# Patient Record
Sex: Male | Born: 1980 | Race: White | Hispanic: No | Marital: Single | Smoking: Current every day smoker
Health system: Southern US, Community
[De-identification: ages and names within clinical notes are randomized; demographics above are authoritative.]

## PROBLEM LIST (undated history)

## (undated) DIAGNOSIS — F32A Depression, unspecified: Secondary | ICD-10-CM

## (undated) DIAGNOSIS — F329 Major depressive disorder, single episode, unspecified: Secondary | ICD-10-CM

## (undated) HISTORY — PX: WISDOM TOOTH EXTRACTION: SHX21

## (undated) HISTORY — PX: MEDIAL COLLATERAL LIGAMENT AND LATERAL COLLATERAL LIGAMENT REPAIR, KNEE: SHX2017

## (undated) HISTORY — PX: ANTERIOR CRUCIATE LIGAMENT REPAIR: SHX115

## (undated) HISTORY — PX: KNEE ARTHROSCOPY WITH POSTERIOR CRUCIATE LIGAMENT (PCL) RECONSTRUCTION: SHX5657

---

## 2009-04-28 ENCOUNTER — Emergency Department (HOSPITAL_COMMUNITY): Admission: EM | Admit: 2009-04-28 | Discharge: 2009-04-29 | Payer: Self-pay | Admitting: Emergency Medicine

## 2009-04-29 ENCOUNTER — Emergency Department (HOSPITAL_COMMUNITY): Admission: EM | Admit: 2009-04-29 | Discharge: 2009-04-29 | Payer: Self-pay | Admitting: Emergency Medicine

## 2013-09-09 ENCOUNTER — Encounter (HOSPITAL_COMMUNITY): Payer: Self-pay | Admitting: Emergency Medicine

## 2013-09-09 ENCOUNTER — Emergency Department (HOSPITAL_COMMUNITY)
Admission: EM | Admit: 2013-09-09 | Discharge: 2013-09-09 | Disposition: A | Discharge status: Home / Self Care | Payer: Self-pay | Attending: Emergency Medicine | Admitting: Emergency Medicine

## 2013-09-09 DIAGNOSIS — F17200 Nicotine dependence, unspecified, uncomplicated: Secondary | ICD-10-CM | POA: Insufficient documentation

## 2013-09-09 DIAGNOSIS — F1010 Alcohol abuse, uncomplicated: Secondary | ICD-10-CM | POA: Insufficient documentation

## 2013-09-09 DIAGNOSIS — F10929 Alcohol use, unspecified with intoxication, unspecified: Secondary | ICD-10-CM

## 2013-09-09 DIAGNOSIS — G479 Sleep disorder, unspecified: Secondary | ICD-10-CM | POA: Insufficient documentation

## 2013-09-09 DIAGNOSIS — F430 Acute stress reaction: Secondary | ICD-10-CM | POA: Insufficient documentation

## 2013-09-09 DIAGNOSIS — R45851 Suicidal ideations: Secondary | ICD-10-CM | POA: Insufficient documentation

## 2013-09-09 DIAGNOSIS — F3289 Other specified depressive episodes: Secondary | ICD-10-CM | POA: Insufficient documentation

## 2013-09-09 DIAGNOSIS — F32A Depression, unspecified: Secondary | ICD-10-CM

## 2013-09-09 DIAGNOSIS — F329 Major depressive disorder, single episode, unspecified: Secondary | ICD-10-CM

## 2013-09-09 HISTORY — DX: Depression, unspecified: F32.A

## 2013-09-09 HISTORY — DX: Major depressive disorder, single episode, unspecified: F32.9

## 2013-09-09 LAB — CBC
HCT: 47.8 % (ref 39.0–52.0)
Hemoglobin: 16.3 g/dL (ref 13.0–17.0)
MCH: 31.5 pg (ref 26.0–34.0)
MCHC: 34.1 g/dL (ref 30.0–36.0)
MCV: 92.3 fL (ref 78.0–100.0)
Platelets: 267 10*3/uL (ref 150–400)
RBC: 5.18 MIL/uL (ref 4.22–5.81)
RDW: 12.4 % (ref 11.5–15.5)
WBC: 10.3 10*3/uL (ref 4.0–10.5)

## 2013-09-09 LAB — RAPID URINE DRUG SCREEN, HOSP PERFORMED
Amphetamines: NOT DETECTED
Barbiturates: NOT DETECTED
Benzodiazepines: NOT DETECTED
Cocaine: NOT DETECTED
Opiates: NOT DETECTED
Tetrahydrocannabinol: NOT DETECTED

## 2013-09-09 LAB — COMPREHENSIVE METABOLIC PANEL
ALT: 113 U/L — ABNORMAL HIGH (ref 0–53)
AST: 58 U/L — ABNORMAL HIGH (ref 0–37)
Albumin: 4.7 g/dL (ref 3.5–5.2)
Alkaline Phosphatase: 92 U/L (ref 39–117)
BUN: 10 mg/dL (ref 6–23)
CO2: 23 mEq/L (ref 19–32)
Calcium: 9.6 mg/dL (ref 8.4–10.5)
Chloride: 98 mEq/L (ref 96–112)
Creatinine, Ser: 0.8 mg/dL (ref 0.50–1.35)
GFR calc Af Amer: >90 mL/min (ref >90)
GFR calc non Af Amer: >90 mL/min (ref >90)
Glucose, Bld: 107 mg/dL — ABNORMAL HIGH (ref 70–99)
Potassium: 4.1 mEq/L (ref 3.7–5.3)
Sodium: 138 mEq/L (ref 137–147)
Total Bilirubin: 0.3 mg/dL (ref 0.3–1.2)
Total Protein: 8.8 g/dL — ABNORMAL HIGH (ref 6.0–8.3)

## 2013-09-09 LAB — ACETAMINOPHEN LEVEL

## 2013-09-09 LAB — ETHANOL: ALCOHOL ETHYL (B): 322 mg/dL — AB (ref 0–11)

## 2013-09-09 LAB — SALICYLATE LEVEL: Salicylate Lvl: <2 mg/dL — ABNORMAL LOW (ref 2.8–20.0)

## 2013-09-09 NOTE — BH Assessment (Signed)
Assessment Note   Patient is a 33 year old white male that reports an inability to sleep.  Patient reports that he only sleeps for 1 hour nightly.  Patient reports feeling of irritability since he is not able to sleep.    Patient reports that his inability to sleep has caused him to think about harming himself.  Patient denies having a plan to harm himself.  Patient denies previous incidents of harming himself.  Patient denies prior suicide attempts. Patient denies prior psychiatric hospitalizations.    Patient denies prior counseling or mental health treatment as an adult.  Patient reports counseling in high school due to low self-esteem.  Patient reports that he has had history of depression but has never taken medications. Presents feeling more depressed tonight with because he is currently unemployed and living with his mother. Patient denies physical, sexual and emotional abuse.   Patient reports drinking alcohol last night one beer.  Patient denies having any problems with alcohol or drugs.  Patient denies detox or treatment facilities.  However, patient has BAL of 322.  Patient denies wanting services for detox or treatment. Patient expresses concern that he is chronically sleep deprived. When he was employed and had health insurance, he was evaluated by his primary care physician for possible sleep apnea. He was recommended to try losing weight first which he did. He continued to have sleep problems. Before getting any sleep study the patient lost his job and insurance.        Axis I: Major Depression, single episode Axis II: Deferred Axis III:  Past Medical History  Diagnosis Date  . Depression    Axis IV: economic problems, occupational problems, other psychosocial or environmental problems, problems with access to health care services and problems with primary support group Axis V: 31-40 impairment in reality testing  Past Medical History:  Past Medical History  Diagnosis Date   . Depression     Past Surgical History  Procedure Laterality Date  . Wisdom tooth extraction    . Anterior cruciate ligament repair    . Medial collateral ligament and lateral collateral ligament repair, knee    . Knee arthroscopy with posterior cruciate ligament (pcl) reconstruction      Family History: History reviewed. No pertinent family history.  Social History:  reports that he has been smoking Cigarettes.  He has been smoking about 0.50 packs per day. He has never used smokeless tobacco. He reports that he drinks alcohol. He reports that he does not use illicit drugs.  Additional Social History:     CIWA: CIWA-Ar BP: 127/74 mmHg Pulse Rate: 103 COWS:    Allergies: No Known Allergies  Home Medications:  (Not in a hospital admission)  OB/GYN Status:  No LMP for male patient.  General Assessment Data [facility] Is this a Tele or Face-to-Face Assessment?: Face-to-Face Is this an Initial Assessment or a Re-assessment for this encounter?: Initial Assessment Living Arrangements: Parent (His mother and her boy ) Can pt return to current living arrangement?: Yes Admission Status: Voluntary Is patient capable of signing voluntary admission?: Yes Transfer from: Acute Hospital Referral Source: Self/Family/Friend  Medical Screening Exam [facility] Walk-in ONLY) Medical Exam completed: Yes  [facility] Crisis Care Plan Living Arrangements: Parent (His mother and her boy ) Name of Psychiatrist: NA Name of Therapist: NA  Education Status Is patient currently in school?: No Current Grade: NA Highest grade of school patient has completed: NA Name of school: NA Contact person: NA  Risk to self Suicidal Ideation: Yes-Currently Present Suicidal Intent: No Is patient at risk for suicide?: Yes Suicidal Plan?: No Access to Means: No What has been your use of drugs/alcohol within the last 12 months?: Alcohol  Previous Attempts/Gestures: No How many times?: 0 Other  Self Harm Risks: NA Triggers for Past Attempts: Unpredictable Intentional Self Injurious Behavior: None Family Suicide History: No Recent stressful life event(s): Job Loss;Financial Problems Persecutory voices/beliefs?: No Depression: Yes Depression Symptoms: Insomnia;Isolating;Guilt;Feeling worthless/self pity;Feeling angry/irritable Substance abuse history and/or treatment for substance abuse?: Yes Suicide prevention information given to non-admitted patients: Yes  Risk to Others Homicidal Ideation: No Thoughts of Harm to Others: No Current Homicidal Intent: No Current Homicidal Plan: No Access to Homicidal Means: No Identified Victim: NA History of harm to others?: No Assessment of Violence: None Noted Violent Behavior Description: NA Does patient have access to weapons?: No Criminal Charges Pending?: No Does patient have a court date: No  Psychosis Hallucinations: None noted Delusions: None noted  Mental Status Report Appear/Hygiene: Disheveled Eye Contact: Fair Motor Activity: Freedom of movement Speech: Logical/coherent Level of Consciousness: Quiet/awake Mood: Anxious;Depressed Affect: Appropriate to circumstance Anxiety Level: Minimal Thought Processes: Coherent;Relevant Judgement: Unimpaired Orientation: Person;Place;Time;Situation Obsessive Compulsive Thoughts/Behaviors: None  Cognitive Functioning Concentration: Decreased Memory: Recent Impaired;Remote Impaired IQ: Average Insight: Fair Impulse Control: Fair Appetite: Fair Weight Loss: 0 Weight Gain: 0 Sleep: Decreased Total Hours of Sleep: 1 Vegetative Symptoms: Decreased grooming  ADLScreening ([facility] Assessment Services) Patient's cognitive ability adequate to safely complete daily activities?: Yes Patient able to express need for assistance with ADLs?: Yes Independently performs ADLs?: Yes (appropriate for developmental age)  Prior Inpatient Therapy Prior Inpatient Therapy: No Prior Therapy  Dates: NA Prior Therapy Facilty/Provider(s): NA Reason for Treatment: NA  Prior Outpatient Therapy Prior Outpatient Therapy: No Prior Therapy Dates: NA Prior Therapy Facilty/Provider(s): NA Reason for Treatment: NA  ADL Screening (condition at time of admission) Patient's cognitive ability adequate to safely complete daily activities?: Yes Patient able to express need for assistance with ADLs?: Yes Independently performs ADLs?: Yes (appropriate for developmental age)         Values / Beliefs Cultural Requests During Hospitalization: None Spiritual Requests During Hospitalization: None        Additional Information 1:1 In Past 12 Months?: No CIRT Risk: No Elopement Risk: No Does patient have medical clearance?: No     Disposition: Pending psych consult  Disposition Initial Assessment Completed for this Encounter: Yes Disposition of Patient: Other dispositions Other disposition(s): Other (Comment) (Pending psych consult in the morning )  On Site Evaluation by:   Reviewed with Physician:    Phillip HealStevenson, Tahnee Cifuentes [place] 09/09/2013 6:20 AM

## 2013-09-09 NOTE — Discharge Instructions (Signed)
Called to find a primary care physician to schedule an outpatient sleep study as discussed  Emergency Department Resource Guide 1) Find a Doctor and Pay Out of Pocket Although you won't have to find out who is covered by your insurance plan, it is a good idea to ask around and get recommendations. You will then need to call the office and see if the doctor you have chosen will accept you as a new patient and what types of options they offer for patients who are self-pay. Some doctors offer discounts or will set up payment plans for their patients who do not have insurance, but you will need to ask so you aren't surprised when you get to your appointment.  2) Contact Your Local Health Department Not all health departments have doctors that can see patients for sick visits, but many do, so it is worth a call to see if yours does. If you don't know where your local health department is, you can check in your phone book. The CDC also has a tool to help you locate your state's health department, and many state websites also have listings of all of their local health departments.  3) Find a Walk-in Clinic If your illness is not likely to be very severe or complicated, you may want to try a walk in clinic. These are popping up all over the country in pharmacies, drugstores, and shopping centers. They're usually staffed by nurse practitioners or physician assistants that have been trained to treat common illnesses and complaints. They're usually fairly quick and inexpensive. However, if you have serious medical issues or chronic medical problems, these are probably not your best option.  No Primary Care Doctor: - Call Health Connect at  [phone] - they can help you locate a primary care doctor that  accepts your insurance, provides certain services, etc. - Physician Referral Service- [phone]  Chronic Pain Problems: Organization         Address  Phone   Notes  [facility]  [phone]  [phone] Patients need to be referred by their primary care doctor.   Medication Assistance: Organization         Address  Phone   Notes  [facility] Med[facility] [address] [place], [place] [address] [phone] --Must be a resident of [place] [facility] [place] -- Must have NO insurance coverage whatsoever (no Medicaid/ Medicare, etc.) -- The pt. MUST have a primary care doctor that directs their care regularly and follows them in the community   MedAssist  [phone]   [employer]  [phone]    Agencies that provide inexpensive medical care: Organization         Address  Phone   Notes  [facility]  [phone]   [facility]    [phone]   [facility] [address] [place], [place] [address] [phone]   [facility] [address], [place] [phone]   Planned Parenthood    [phone]) [phone]   [facility]    [phone]   [facility]  [address], [place] Phone:  [phone], Fax:  [phone] Hours of Operation:  9 am - 6 pm, M-F.  Also accepts Medicaid/Medicare and self-pay.  [facility]  [address], [place] Phone: [phone], Fax: [phone]. Hours of Operation:  8:30  am - 5:30 pm, M-F.  Also accepts Medicaid and self-pay.  [facility] [address], [place] Phone: [phone]   [place], [place], [place] [phone], Ext. [phone] Mondays & Thursdays: 7-9 AM.  First 15 patients are seen on a first come, first serve basis.    [place] Providers:  Organization         Address  Phone   Notes  [facility] [address], [place] [phone] Also accepts self-pay patients.  [facility] [address], [place]   [phone]   [place], [address], [place] [phone]   [facility] [address], [place] [phone]   Lucianne Lei [address], [place]   [phone] Only accepts [place] Access [place] patients after they have their name applied to their card.   Self-Pay (no insurance) in [facility]:  Organization         Address  Phone   Notes  Sickle Cell Patients, [facility] [place] [phone]   [facility] Urgent Care [place] [phone]   [facility] [place]  [place], [place], [place] [phone]   [facility]/Dr. Osei-Bonsu  [address], [place] or [address], [place] [phone] Phone number for both [place] and [place] locations is the same.  Urgent Medical and [facility] [address] [place] [phone]   [facility] [address] [place] or [address] [phone]   [facility] [address] [place] [phone], phone; [phone], fax Sees patients 1st and 3rd Saturday of every month.  Must not qualify for public or private insurance (i.e. Medicaid, Medicare, [place] Health Choice, Veterans' Benefits)  Household income should be no more than 200% of the poverty level The clinic cannot treat you if you are pregnant or think you are pregnant  Sexually transmitted diseases are not treated at the clinic.    Dental Care: Organization         Address  Phone  Notes  [facility] Department of [facility] [place] [phone] Accepts children up to age 64 who are enrolled in [place] or [place]; pregnant women with a Medicaid card; and children who have applied for Medicaid or [place] Health Choice, but  were declined, whose parents can pay a reduced fee at time of service.  [facility] Department of [facility]  [address], [place] [phone] Accepts children up to age 16 who are enrolled in [place] or [facility]; pregnant women with a Medicaid card; and children who have applied for Medicaid or [place] Health Choice, but were declined, whose parents can pay a reduced fee at time of service.  [place] Adult [facility] Access PROGRAM  [place] [phone] Patients are seen by appointment only. Walk-ins are not accepted. [place] will see patients 24 years of age and older. Monday - Tuesday (8am-5pm) Most Wednesdays (8:30-5pm) $30 per visit, cash only  [facility] Adult Dental Access PROGRAM  [address], [facility] [phone] Patients are seen by appointment only.  Walk-ins are not accepted. [place] will see patients 9 years of age and older. One Wednesday Evening (Monthly: Volunteer Based).  $30 per visit, cash only  [place]  [phone] for adults; Children under age 55, call Graduate Pediatric Dentistry at [phone]. Children aged 30-14, please call [phone] to request a pediatric application.  Dental services are provided in all areas of dental care including fillings, crowns and bridges, complete and partial dentures, implants, gum treatment, root canals, and extractions. Preventive care is also provided. Treatment is provided to both adults and children. Patients are selected via a lottery and there is often a waiting list.   [facility] [address] [place]  [phone] [email or web address]   [facility] [address] [place], [place] [phone], Ext. [phone] Second and Fourth Thursday of each month, opens at 6:30 AM; Clinic ends at 9 AM.  Patients are seen on a first-come first-served basis, and a limited number are seen during each clinic.    [facility]  [address] [place] Danker [place], [place] [phone]   Eligibility Requirements You must have lived in [place], [place], or [place] counties for at least the last three months.   You cannot be eligible for state or federal sponsored [employer], including [employer], [place], or [employer].   You generally cannot be eligible for healthcare insurance through your employer.    How to apply: Eligibility screenings are held every Tuesday and Wednesday afternoon from 1:00 pm until 4:00 pm. You do not need an appointment for the interview!  [facility] [address] [place], [place]   [place]  [place] Department  [place]  [phone]    Behavioral Health Resources in the Community: Intensive Outpatient Programs Organization         Address  Phone  Notes  [place]. [address] [place], [place] [phone]   [facility] Outpatient [address] [place], [place]   [facility]: [facility] [address] [place], [place]   [place] [address],  [place], [place] or [phone]   Substance Abuse Resources Organization         Address  Phone  Notes  Alcohol and Drug Services  [phone]   [place]  [phone]   [facility] [place]   Chinita Pester  [phone]   Residential & Outpatient Substance Abuse Program  [phone]   Psychological Services Organization         Address  Phone  Notes  [facility] [place]  [place]  [phone]   [place] [address] [place] or [phone]    Mobile Crisis Teams Organization         Address  Phone  Notes  [facility], Mobile Crisis Care  Unit  [phone]   [facility]  [address] [place], [place]   [place] Levels [address] [place] [phone]    Self-Help/Support Groups Organization         Address  Phone             Notes  [place]. of [place] - variety of support groups  [place] Call for more information  Narcotics  Anonymous (NA), Caring Services [address], [employer] [place]  2 meetings at this location   Residential Facilities manager         Address  Phone  Notes  [facility] [place],    [place]  [phone]   [place]  [address] [place] 695072, [place], [place]   [place], [place] [phone] Admissions: 8am-3pm M-F  Incentives Substance [place] [address]    [place], [place] [phone]   [facility] [address] [place], [place], [place]   [facility] [address],  [place], [place]   [facility] Programs - Intensive Outpatient [address] Kristeen [address], [place], [place]   [facility] ([place] [place].,  [place], [place] [phone] or [phone]   [facility] Services (RTS) [address] [place], [place] Accepts Medicaid  [facility] [place] [address],  [place] [phone] Substance Abuse/Addiction Treatment   [facility] Organization         Address  Phone  Notes  [facility]  [phone]   Domenic Schwab, PhD [address] Arlis Port[place], [place]   [phone] or [phone]   [place], [place] [phone]   [facility] [address], [place], [place] [phone] Insurance/Medicaid/sponsorship through [facility] and Families [address]                                     [place], [place] [phone] [place] [address] [place], [place] [phone]    Dr. Adele Schilder  [phone]   [facility] Dept. 1) [address]  2) [place] 3)  [place] [address], [place] [phone]  [phone]   [place] [phone] or [phone] (After Hours)

## 2013-09-09 NOTE — Consult Note (Signed)
[facility] Consult   Reason for Consult:  Alcohol intoxication, Depression Referring Physician:  EDP Drew Wolf is an 33 y.o. male. Total Time spent with patient: 45 minutes  Assessment: AXIS I:  Alcohol Abuse and Major Depression, single episode AXIS II:  Deferred AXIS III:   Past Medical History  Diagnosis Date  . Depression    AXIS IV:  other psychosocial or environmental problems and problems related to social environment AXIS V:  61-70 mild symptoms  Plan:  No evidence of imminent risk to self or others at present.    Subjective:   Drew Wolf is a 33 y.o. male patient Evaluated for alcohol intoxication, depression.  HPI: Patient is a 33 year old white male that reports an inability to sleep. Patient reports that he only sleeps for 1 hour nightly. Patient reports feeling of irritability since he is not able to sleep.  He also reports that his inability to sleep is due to sleep apnea.  Patient states he sees a doctor at an urgent care facility who advised him to lose weight before he will perform sleep apnea test on him.  Patient states he uses alcohol occassionally to help him sleep.  His alcohol level on arrival was 322 and his LFT are elevated.  Patient denies withdrawal symptoms and no tremors of his fingers are noted.  He does not believe he has alcohol addiction problem but he reported one time CDIOP at the [facility].Drew Wolf He states he is scared of sleeping because his girl friend told him that he holds his breath while  sleeping due to sleep apnea. Patient reports that his inability to sleep has caused him to think about harming himself. Patient denies having a plan to harm himself. Patient denies previous incidents of harming himself. Patient denies prior suicide attempts. Patient denies prior psychiatric hospitalizations.  Patient denies prior counseling or mental health treatment as an adult.. Patient reports that he has had history of depression but  has never taken medications. Patient is unemployed and is living with his mother.  He does not have any outpatient provider or therapist at this time.  He denies SI/HI/AVH.    HPI Elements:   Location:  mood, depression, insomnia. Quality:  mildmoderate, ongoing. Severity:  mild-moderate, ongoing. Context:  insomnia, depression, unemployment.  Past Psychiatric History: Past Medical History  Diagnosis Date  . Depression     reports that he has been smoking Cigarettes.  He has been smoking about 0.50 packs per day. He has never used smokeless tobacco. He reports that he drinks alcohol. He reports that he does not use illicit drugs. History reviewed. No pertinent family history. Family History Substance Abuse: No Family Supports: Yes, List: Living Arrangements: Parent (His mother and her boy ) Can pt return to current living arrangement?: Yes   Allergies:  No Known Allergies  ACT Assessment Complete:  Yes:    Educational Status    Risk to Self: Risk to self Suicidal Ideation: Yes-Currently Present Suicidal Intent: No Is patient at risk for suicide?: Yes Suicidal Plan?: No Access to Means: No What has been your use of drugs/alcohol within the last 12 months?: Alcohol  Previous Attempts/Gestures: No How many times?: 0 Other Self Harm Risks: NA Triggers for Past Attempts: Unpredictable Intentional Self Injurious Behavior: None Family Suicide History: No Recent stressful life event(s): Job Loss;Financial Problems Persecutory voices/beliefs?: No Depression: Yes Depression Symptoms: Insomnia;Isolating;Guilt;Feeling worthless/self pity;Feeling angry/irritable Substance abuse history and/or treatment for substance abuse?: Yes Suicide prevention information  given to non-admitted patients: Yes  Risk to Others: Risk to Others Homicidal Ideation: No Thoughts of Harm to Others: No Current Homicidal Intent: No Current Homicidal Plan: No Access to Homicidal Means: No Identified  Victim: NA History of harm to others?: No Assessment of Violence: None Noted Violent Behavior Description: NA Does patient have access to weapons?: No Criminal Charges Pending?: No Does patient have a court date: No  Abuse:    Prior Inpatient Therapy: Prior Inpatient Therapy Prior Inpatient Therapy: No Prior Therapy Dates: NA Prior Therapy Facilty/Provider(s): NA Reason for Treatment: NA  Prior Outpatient Therapy: Prior Outpatient Therapy Prior Outpatient Therapy: No Prior Therapy Dates: NA Prior Therapy Facilty/Provider(s): NA Reason for Treatment: NA  Additional Information: Additional Information 1:1 In Past 12 Months?: No CIRT Risk: No Elopement Risk: No Does patient have medical clearance?: No                  Objective: Blood pressure 100/63, pulse 110, temperature 97.3 F (36.3 C), temperature source Oral, resp. rate 16, SpO2 98.00%.There is no height or weight on file to calculate BMI. Results for orders placed during the hospital encounter of 09/09/13 (from the past 72 hour(s))  URINE RAPID DRUG SCREEN (HOSP PERFORMED)     Status: None   Collection Time    09/09/13 12:50 AM      Result Value Ref Range   Opiates NONE DETECTED  NONE DETECTED   Cocaine NONE DETECTED  NONE DETECTED   Benzodiazepines NONE DETECTED  NONE DETECTED   Amphetamines NONE DETECTED  NONE DETECTED   Tetrahydrocannabinol NONE DETECTED  NONE DETECTED   Barbiturates NONE DETECTED  NONE DETECTED   Comment:            DRUG SCREEN FOR MEDICAL PURPOSES     ONLY.  IF CONFIRMATION IS NEEDED     FOR ANY PURPOSE, NOTIFY LAB     WITHIN 5 DAYS.                LOWEST DETECTABLE LIMITS     FOR URINE DRUG SCREEN     Drug Class       Cutoff (ng/mL)     Amphetamine      1000     Barbiturate      200     Benzodiazepine   453     Tricyclics       646     Opiates          300     Cocaine          300     THC              50  ACETAMINOPHEN LEVEL     Status: None   Collection Time     09/09/13 12:56 AM      Result Value Ref Range   Acetaminophen (Tylenol), Serum <15.0  10 - 30 ug/mL   Comment:            THERAPEUTIC CONCENTRATIONS VARY     SIGNIFICANTLY. A RANGE OF 10-30     ug/mL MAY BE AN EFFECTIVE     CONCENTRATION FOR MANY PATIENTS.     HOWEVER, SOME ARE BEST TREATED     AT CONCENTRATIONS OUTSIDE THIS     RANGE.     ACETAMINOPHEN CONCENTRATIONS     >150 ug/mL AT 4 HOURS AFTER     INGESTION AND >50 ug/mL AT 12     HOURS AFTER INGESTION ARE  OFTEN ASSOCIATED WITH TOXIC     REACTIONS.  CBC     Status: None   Collection Time    09/09/13 12:56 AM      Result Value Ref Range   WBC 10.3  4.0 - 10.5 K/uL   RBC 5.18  4.22 - 5.81 MIL/uL   Hemoglobin 16.3  13.0 - 17.0 g/dL   HCT 47.8  39.0 - 52.0 %   MCV 92.3  78.0 - 100.0 fL   MCH 31.5  26.0 - 34.0 pg   MCHC 34.1  30.0 - 36.0 g/dL   RDW 12.4  11.5 - 15.5 %   Platelets 267  150 - 400 K/uL  COMPREHENSIVE METABOLIC PANEL     Status: Abnormal   Collection Time    09/09/13 12:56 AM      Result Value Ref Range   Sodium 138  137 - 147 mEq/L   Potassium 4.1  3.7 - 5.3 mEq/L   Chloride 98  96 - 112 mEq/L   CO2 23  19 - 32 mEq/L   Glucose, Bld 107 (*) 70 - 99 mg/dL   BUN 10  6 - 23 mg/dL   Creatinine, Ser 0.80  0.50 - 1.35 mg/dL   Calcium 9.6  8.4 - 10.5 mg/dL   Total Protein 8.8 (*) 6.0 - 8.3 g/dL   Albumin 4.7  3.5 - 5.2 g/dL   AST 58 (*) 0 - 37 U/L   ALT 113 (*) 0 - 53 U/L   Alkaline Phosphatase 92  39 - 117 U/L   Total Bilirubin 0.3  0.3 - 1.2 mg/dL   GFR calc non Af Amer >90  >90 mL/min   GFR calc Af Amer >90  >90 mL/min   Comment: (NOTE)     The eGFR has been calculated using the CKD EPI equation.     This calculation has not been validated in all clinical situations.     eGFR's persistently <90 mL/min signify possible Chronic Kidney     Disease.  ETHANOL     Status: Abnormal   Collection Time    09/09/13 12:56 AM      Result Value Ref Range   Alcohol, Ethyl (B) 322 (*) 0 - 11 mg/dL   Comment:             LOWEST DETECTABLE LIMIT FOR     SERUM ALCOHOL IS 11 mg/dL     FOR MEDICAL PURPOSES ONLY  SALICYLATE LEVEL     Status: Abnormal   Collection Time    09/09/13 12:56 AM      Result Value Ref Range   Salicylate Lvl <5.6 (*) 2.8 - 20.0 mg/dL   Labs are reviewed and are pertinent for Alcohol level 322, elevated LFT .  No current facility-administered medications for this encounter.   Current Outpatient Prescriptions  Medication Sig Dispense Refill  . dextromethorphan (DELSYM) 30 MG/5ML liquid Take 30 mg by mouth as needed for cough.       Physical examination performed by EDP this am 09/09/13 is unremarkable. Psychiatric Specialty Exam:     Blood pressure 100/63, pulse 110, temperature 97.3 F (36.3 C), temperature source Oral, resp. rate 16, SpO2 98.00%.There is no height or weight on file to calculate BMI.  General Appearance: Casual  Eye Contact::  Fair  Speech:  Clear and Coherent and Normal Rate  Volume:  Normal  Mood:  Depressed  Affect:  Congruent, Depressed and Flat  Thought Process:  Coherent and Goal Directed  Orientation:  Full (Time, Place, and Person)  Thought Content:  NA  Suicidal Thoughts:  No  Homicidal Thoughts:  No  Memory:  Immediate;   Good Recent;   Good Remote;   Good  Judgement:  Fair  Insight:  Fair  Psychomotor Activity:  Normal  Concentration:  Good  Recall:  NA  Fund of Knowledge:Good  Language: Good  Akathisia:  NA  Handed:  Right  AIMS (if indicated):     Assets:  Desire for Improvement  Sleep:      Musculoskeletal: Strength & Muscle Tone: within normal limits Gait & Station: normal Patient leans: N/A  Treatment Plan Summary:  Consult and face to face interview with Dr Adele Schilder Patient is discharged with referral for CDIOP preferably at our clinic. Discharging home with information for CDIOP referral  Delfin Gant   PMHNP-BC 09/09/2013 11:26 AM  I have personally seen the patient and agreed with the findings and  involved in the treatment plan. Berniece Andreas, MD

## 2013-09-09 NOTE — ED Notes (Signed)
TTS into see 

## 2013-09-09 NOTE — ED Notes (Signed)
Pt reports increased depression, states there is not any known increase in stress or stressors that have caused this, pt denies taking medication for depression, when asked if he was suicidal pt stated "No, but I don't know that I wouldn't put myself in situations I shouldn't be in." Denies HI or AVH. Pt makes poor eye contact. Pt a&O x4.

## 2013-09-09 NOTE — ED Notes (Signed)
Pt discharged to home with family.  Discharge instructions both verbal and written to pt with verbalization of understanding.  Discharge instructions to include follow up care, suicide safety prevention, and community resource list.  All belongings in pts possession and signed for.  Dr. Lolly MustacheArfeen assessed and talked to pt prior to discharge answering any questions or concerns.   Denies HI/SI, auditory or visual hallucinations on discharge.  No distress noted at discharge.  Pt excited and ready for discharge, with no further questions.  Pt escorted to lobby for discharge.

## 2013-09-09 NOTE — ED Provider Notes (Signed)
CSN: [id number]     Arrival date & time 09/09/13  0013 History   First MD Initiated Contact with Patient 09/09/13 0253     Chief Complaint  Patient presents with  . Medical Clearance     (Consider location/radiation/quality/duration/timing/severity/associated sxs/prior Treatment) HPI History provided by patient. Has a history of depression but has never taken medications. Presents feeling more depressed tonight with some intermittent thoughts of suicide - no specific plan. Patient is currently unemployed, admits to stressors at home including ongoing difficulties with relationships. Patient admits to alcohol use, denies any drugs or ingestions otherwise. He called 911 tonight. He denies any self injury.  Patient expresses concern that he is chronically sleep deprived. When he was employed and had health insurance, he was evaluated by his primary care physician for possible sleep apnea.  He was recommended to try losing weight first which he did. He continued to have sleep problems. Before getting any sleep study, lost his job and insurance. He reports that multiple girlfriends over time have told him that he wakes up choking in his sleep.    Past Medical History  Diagnosis Date  . Depression    Past Surgical History  Procedure Laterality Date  . Wisdom tooth extraction    . Anterior cruciate ligament repair    . Medial collateral ligament and lateral collateral ligament repair, knee    . Knee arthroscopy with posterior cruciate ligament (pcl) reconstruction     History reviewed. No pertinent family history. History  Substance Use Topics  . Smoking status: Current Some Day Smoker -- 0.50 packs/day    Types: Cigarettes  . Smokeless tobacco: Never Used  . Alcohol Use: Yes     Comment: occ.    Review of Systems  Constitutional: Negative for fever and chills.  Eyes: Negative for visual disturbance.  Respiratory: Negative for shortness of breath.   Cardiovascular: Negative for chest  pain.  Gastrointestinal: Negative for vomiting and abdominal pain.  Genitourinary: Negative for dysuria.  Musculoskeletal: Negative for back pain, neck pain and neck stiffness.  Skin: Negative for rash.  Neurological: Negative for headaches.  Psychiatric/Behavioral: Positive for sleep disturbance. Negative for hallucinations.  All other systems reviewed and are negative.      Allergies  Review of patient's allergies indicates no known allergies.  Home Medications   Current Outpatient Rx  Name  Route  Sig  Dispense  Refill  . dextromethorphan (DELSYM) 30 MG/5ML liquid   Oral   Take 30 mg by mouth as needed for cough.          BP 134/87  Pulse 113  Temp(Src) 97.9 F (36.6 C) (Oral)  Resp 20  SpO2 95% Physical Exam  Constitutional: He is oriented to person, place, and time. He appears well-developed and well-nourished.  HENT:  Head: Normocephalic and atraumatic.  Eyes: EOM are normal. Pupils are equal, round, and reactive to light.  Neck: Neck supple.  Cardiovascular: Normal rate, regular rhythm and intact distal pulses.   Pulmonary/Chest: Effort normal and breath sounds normal. No respiratory distress.  Abdominal: Soft. Bowel sounds are normal. He exhibits no distension. There is no tenderness.  Musculoskeletal: Normal range of motion. He exhibits no edema.  Neurological: He is alert and oriented to person, place, and time.  Skin: Skin is warm and dry.  Psychiatric:  Flat, Depressed affect    ED Course  Procedures (including critical care time) Labs Review Labs Reviewed  COMPREHENSIVE METABOLIC PANEL - Abnormal; Notable for the following:  Glucose, Bld 107 (*)    Total Protein 8.8 (*)    AST 58 (*)    ALT 113 (*)    All other components within normal limits  ETHANOL - Abnormal; Notable for the following:    Alcohol, Ethyl (B) 322 (*)    All other components within normal limits  SALICYLATE LEVEL - Abnormal; Notable for the following:    Salicylate Lvl  <2.0 (*)    All other components within normal limits  ACETAMINOPHEN LEVEL  CBC  URINE RAPID DRUG SCREEN (HOSP PERFORMED)   Plan: allow patient to sober, then TTS evaluation.  PT will be given outpatient resources document for primary care followup, potentially would benefit from sleep study.   MDM   Diagnosis: Depression, alcohol intoxication   Labs obtained/reviewed as above - alcohol 322      Sunnie NielsenBrian Mattilyn Crites, MD 09/09/13 [phone]

## 2013-09-09 NOTE — ED Notes (Addendum)
Dr Lolly MustacheArfeen. And josephine NP into see

## 2013-09-09 NOTE — [facility] Suicide Risk Assessment (Signed)
Suicide Risk Assessment  Discharge Assessment     Demographic Factors:  Male, Adolescent or young adult and Unemployed  Total Time spent with patient: 45 minutes  Psychiatric Specialty Exam:     Blood pressure 100/63, pulse 110, temperature 97.3 F (36.3 C), temperature source Oral, resp. rate 16, SpO2 98.00%.There is no height or weight on file to calculate BMI.  General Appearance: Casual and Fairly Groomed  Patent attorneyye Contact::  Fair  Speech:  Clear and Coherent and Normal Rate  Volume:  Normal  Mood:  Depressed  Affect:  Congruent, Depressed and Flat  Thought Process:  Coherent and Goal Directed  Orientation:  Full (Time, Place, and Person)  Thought Content:  NA  Suicidal Thoughts:  No  Homicidal Thoughts:  No  Memory:  Immediate;   Good Recent;   Good Remote;   Good  Judgement:  Good  Insight:  Fair  Psychomotor Activity:  Normal  Concentration:  Good  Recall:  NA  Fund of Knowledge:Good  Language: Good  Akathisia:  NA  Handed:  Right  AIMS (if indicated):     Assets:  Desire for Improvement  Sleep:       Musculoskeletal: Strength & Muscle Tone: within normal limits Gait & Station: normal Patient leans: N/A   Mental Status Per Nursing Assessment::   On Admission:     Current Mental Status by Physician: NA  Loss Factors: NA  Historical Factors: NA  Risk Reduction Factors:   Sense of responsibility to family, Religious beliefs about death, Living with another person, especially a relative and Positive therapeutic relationship  Continued Clinical Symptoms:  Alcohol/Substance Abuse/Dependencies  Cognitive Features That Contribute To Risk:  Polarized thinking    Suicide Risk:  Minimal: No identifiable suicidal ideation.  Patients presenting with no risk factors but with morbid ruminations; may be classified as minimal risk based on the severity of the depressive symptoms  Discharge Diagnoses:   AXIS I:  Major Depression, single episode and Alcohol  abuse AXIS II:  Deferred AXIS III:   Past Medical History  Diagnosis Date  . Depression    AXIS IV:  other psychosocial or environmental problems and problems related to social environment AXIS V:  61-70 mild symptoms  Plan Of Care/Follow-up recommendations:  Activity:  as tolerated Diet:  regular  Is patient on multiple antipsychotic therapies at discharge:  No   Has Patient had three or more failed trials of antipsychotic monotherapy by history:  No  Recommended Plan for Multiple Antipsychotic Therapies: NA    Ronte Parker, C  PMHNP-BC 09/09/2013, 11:53 AM

## 2013-09-09 NOTE — Consult Note (Signed)
Psychiatry to d/c home.  Provided pt with optx follow up to IOP (CD ) to [facility].

## 2016-05-12 ENCOUNTER — Ambulatory Visit (INDEPENDENT_AMBULATORY_CARE_PROVIDER_SITE_OTHER): Payer: 59 | Admitting: Family Medicine

## 2016-05-12 VITALS — BP 136/94 | HR 78 | Temp 98.3°F | Resp 17 | Ht 76.0 in | Wt 242.0 lb

## 2016-05-12 DIAGNOSIS — R7989 Other specified abnormal findings of blood chemistry: Secondary | ICD-10-CM

## 2016-05-12 DIAGNOSIS — R945 Abnormal results of liver function studies: Secondary | ICD-10-CM

## 2016-05-12 DIAGNOSIS — Z23 Encounter for immunization: Secondary | ICD-10-CM

## 2016-05-12 DIAGNOSIS — Z0000 Encounter for general adult medical examination without abnormal findings: Secondary | ICD-10-CM

## 2016-05-12 DIAGNOSIS — R0683 Snoring: Secondary | ICD-10-CM | POA: Diagnosis not present

## 2016-05-12 DIAGNOSIS — R739 Hyperglycemia, unspecified: Secondary | ICD-10-CM

## 2016-05-12 LAB — COMPLETE METABOLIC PANEL WITH GFR
ALT: 39 U/L (ref 9–46)
AST: 30 U/L (ref 10–40)
Albumin: 4.8 g/dL (ref 3.6–5.1)
Alkaline Phosphatase: 66 U/L (ref 40–115)
BUN: 14 mg/dL (ref 7–25)
CO2: 24 mmol/L (ref 20–31)
Calcium: 9.7 mg/dL (ref 8.6–10.3)
Chloride: 103 mmol/L (ref 98–110)
Creat: 0.79 mg/dL (ref 0.60–1.35)
GFR, Est African American: >89 mL/min (ref >=60)
GLUCOSE: 93 mg/dL (ref 65–99)
POTASSIUM: 4.4 mmol/L (ref 3.5–5.3)
SODIUM: 136 mmol/L (ref 135–146)
Total Bilirubin: 0.9 mg/dL (ref 0.2–1.2)
Total Protein: 7.4 g/dL (ref 6.1–8.1)

## 2016-05-12 LAB — HEMOGLOBIN A1C
Hgb A1c MFr Bld: 4.9 % (ref <5.7)
Mean Plasma Glucose: 94 mg/dL

## 2016-05-12 NOTE — Patient Instructions (Addendum)
I will refer you to sleep studies to check for sleep apnea. I will also recheck some bloodwork today to look at your blood sugar and liver tests. Exercise 150 minutes per week. Let me know if you have any questions after today's visit.  Keeping you healthy  Get these tests  Blood pressure- Have your blood pressure checked once a year by your healthcare provider.  Normal blood pressure is 120/80.  Weight- Have your body mass index (BMI) calculated to screen for obesity.  BMI is a measure of body fat based on height and weight. You can also calculate your own BMI at [email or web address]-[email or web address]/.  Cholesterol- Have your cholesterol checked regularly starting at age 34, sooner may be necessary if you have diabetes, high blood pressure, if a family member developed heart diseases at an early age or if you smoke.   Chlamydia, HIV, and other sexual transmitted disease- Get screened each year until the age of 34 then within three months of each new sexual partner.  Diabetes- Have your blood sugar checked regularly if you have high blood pressure, high cholesterol, a family history of diabetes or if you are overweight.  Get these vaccines  Flu shot- Every fall.  Tetanus shot- Every 10 years.  Menactra- Single dose; prevents meningitis.  Take these steps  Don't smoke- If you do smoke, ask your healthcare provider about quitting. For tips on how to quit, go to www.smokefree.gov or call 1-800-QUIT-NOW.  Be physically active- Exercise 5 days a week for at least 30 minutes.  If you are not already physically active start slow and gradually work up to 30 minutes of moderate physical activity.  Examples of moderate activity include walking briskly, mowing the yard, dancing, swimming bicycling, etc.  Eat a healthy diet- Eat a variety of healthy foods such as fruits, vegetables, low fat milk, low fat cheese, yogurt, lean meats, poultry, fish, beans, tofu, etc.  For more information on healthy eating, go  to www.thenutritionsource.org  Drink alcohol in moderation- Limit alcohol intake two drinks or less a day.  Never drink and drive.  Dentist- Brush and floss teeth twice daily; visit your dentis twice a year.  Depression-Your emotional health is as important as your physical health.  If you're feeling down, losing interest in things you normally enjoy please talk with your healthcare provider.  Gun Safety- If you keep a gun in your home, keep it unloaded and with the safety lock on.  Bullets should be stored separately.  Helmet use- Always wear a helmet when riding a motorcycle, bicycle, rollerblading or skateboarding.  Safe sex- If you may be exposed to a sexually transmitted infection, use a condom  Seat belts- Seat bels can save your life; always wear one.  Smoke/Carbon Monoxide detectors- These detectors need to be installed on the appropriate level of your home.  Replace batteries at least once a year.  Skin Cancer- When out in the sun, cover up and use sunscreen SPF 15 or higher.  Violence- If anyone is threatening or hurting you, please tell your healthcare provider.   IF you received an x-ray today, you will receive an invoice from [facility]. Please contact [facility] at [phone] with questions or concerns regarding your invoice.   IF you received labwork today, you will receive an invoice from [facility]. Please contact [facility] at [phone] with questions or concerns regarding your invoice.   Our billing staff will not be able to assist you with questions regarding bills from  these companies.  You will be contacted with the lab results as soon as they are available. The fastest way to get your results is to activate your My Chart account. Instructions are located on the last page of this paperwork. If you have not heard from [place] regarding the results in 2 weeks, please contact this office.

## 2016-05-12 NOTE — Progress Notes (Signed)
By signing my name below, I, Drew Wolf, attest that this documentation has been prepared under the direction and in the presence of Drew Staggers, MD.  Electronically Signed: Arvilla Market, Medical Scribe. 05/12/16. 10:19 AM.  Subjective:    Patient ID: Drew Wolf, male    DOB: June 24, 1981, 35 y.o.   MRN: [id number]  HPI Chief Complaint  Patient presents with  . Annual Exam    HPI Comments: Drew Wolf is a 35 y.o. male who presents to the [facility] for his complete physical. Pt is fasting. Pt denies urinary symptoms.  Immunizations: Pt's last tetanus vaccine was in 2005. Pt has not had his flu shot, and deferred the flu shot this year. Pt denies having small children around him at home.  There is no immunization history on file for this patient.  Vision: Pt is not followed by ophthalmologist and doesn't wear contacts/corrective lenses.  Visual Acuity Screening   Right eye Left eye Both eyes  Without correction:     With correction: 20/20 20/15 20/20    Dentist: Pt needs to find a new dentist since his other dentist closed 6 month ago.  Exercise: Pt exercises 1-2x a week.  Hyperglycemia: Glucose of 107 in 09/09/13  STI Screening: Pt was last tested for gonorrhea, chlamydia, and syphilis "a few years ago", hasn't had a new partner in few years, and has been abstinent. Pt is single and denies engaging in unprotected intercourse. Pt deferred STD testing.  Sleep Apnea: Pt sleep through the night and reports snoring. Pt suspects choking in his sleep was correlated to his heavy drinking done at that time. Pt is "fairly energetic" when he starts his day.  Elevated LFTs: Lab Results  Component Value Date   ALT 113 (H) 09/09/2013   AST 58 (H) 09/09/2013   ALKPHOS 92 09/09/2013   BILITOT 0.3 09/09/2013   Depression: Alcohol intoxication in ED visit in 2012. At that time he had some relationship difficulties and stressors, also per that ED note,  there is possibililty of sleep apnea. Recommended testing but first recommended weight loss, continued to have problems after weight loss. Reported hx of his choking in sleep.  Pt states his ED visit for depression in 2012 was correlated to over drinking. Pt has had DUIs in the past but denies current DUIs in this past year. Pt denies dysphoric mood in the past year. Depression screen PHQ 2/9 05/12/2016  Decreased Interest 0  Down, Depressed, Hopeless 0  PHQ - 2 Score 0   FHX: Heart disease and DM in dad. Pt's dad and grandpa was a heavy drinker.  There are no active problems to display for this patient.  Past Medical History:  Diagnosis Date  . Depression    Past Surgical History:  Procedure Laterality Date  . ANTERIOR CRUCIATE LIGAMENT REPAIR    . KNEE ARTHROSCOPY WITH POSTERIOR CRUCIATE LIGAMENT (PCL) RECONSTRUCTION    . MEDIAL COLLATERAL LIGAMENT AND LATERAL COLLATERAL LIGAMENT REPAIR, KNEE    . WISDOM TOOTH EXTRACTION     No Known Allergies Prior to Admission medications   Not on File   Social History   Social History  . Marital status: Single    Spouse name: N/A  . Number of children: N/A  . Years of education: N/A   Occupational History  . Not on file.   Social History Main Topics  . Smoking status: Current Some Day Smoker    Packs/day: 0.50    Types: Cigarettes  .  Smokeless tobacco: Never Used  . Alcohol use Yes     Comment: occ.  . Drug use: No  . Sexual activity: Not on file   Other Topics Concern  . Not on file   Social History Narrative  . No narrative on file   Review of Systems  13 point ROS was negative. Objective:  Physical Exam  Constitutional: He is oriented to person, place, and time. He appears well-developed and well-nourished.  HENT:  Head: Normocephalic and atraumatic.  Right Ear: External ear normal.  Left Ear: External ear normal.  Mouth/Throat: Oropharynx is clear and moist.  Eyes: Conjunctivae and EOM are normal. Pupils are  equal, round, and reactive to light.  Neck: Normal range of motion. Neck supple. No thyromegaly present.  Cardiovascular: Normal rate, regular rhythm, normal heart sounds and intact distal pulses.   Pulmonary/Chest: Effort normal and breath sounds normal. No respiratory distress. He has no wheezes.  Abdominal: Soft. He exhibits no distension. There is no tenderness.  Musculoskeletal: Normal range of motion. He exhibits no edema or tenderness.  Lymphadenopathy:    He has no cervical adenopathy.  Neurological: He is alert and oriented to person, place, and time. He has normal reflexes.  Skin: Skin is warm and dry.  Psychiatric: He has a normal mood and affect. His behavior is normal.  Vitals reviewed.  BP (!) 136/94 (BP Location: Right Arm, Patient Position: Sitting, Cuff Size: Large)   Pulse 78   Temp 98.3 F (36.8 C) (Oral)   Resp 17   Ht 6\' 4"  (1.93 m)   Wt 242 lb (109.8 kg)   SpO2 98%   BMI 29.46 kg/m    Assessment & Plan:  [name] is a 35 y.o. male Annual physical exam  --anticipatory guidance as below in AVS, screening labs above. Health maintenance items as above in HPI discussed/recommended as applicable.   - increase exercise.   Snoring - Plan: Ambulatory referral to Sleep Studies  - refer for sleep study - prior hx of waking up with choking sensation, but not recent.   Need for Tdap vaccination - Plan: Tdap vaccine greater than or equal to 7yo IM  Elevated LFTs - Plan: COMPLETE METABOLIC PANEL WITH GFR  -may have been related to alcohol use prior. Denies recent sx's.   Hyperglycemia - Plan: COMPLETE METABOLIC PANEL WITH GFR, Hemoglobin A1C  - prior. Recheck on CMP and A1c.   No orders of the defined types were placed in this encounter.  Patient Instructions    I will refer you to sleep studies to check for sleep apnea. I will also recheck some bloodwork today to look at your blood sugar and liver tests. Exercise 150 minutes per week. Let me know if you  have any questions after today's visit.  Keeping you healthy  Get these tests  Blood pressure- Have your blood pressure checked once a year by your healthcare provider.  Normal blood pressure is 120/80.  Weight- Have your body mass index (BMI) calculated to screen for obesity.  BMI is a measure of body fat based on height and weight. You can also calculate your own BMI at https://www.west-esparza.com/.  Cholesterol- Have your cholesterol checked regularly starting at age 76, sooner may be necessary if you have diabetes, high blood pressure, if a family member developed heart diseases at an early age or if you smoke.   Chlamydia, HIV, and other sexual transmitted disease- Get screened each year until the age of 1 then within three  months of each new sexual partner.  Diabetes- Have your blood sugar checked regularly if you have high blood pressure, high cholesterol, a family history of diabetes or if you are overweight.  Get these vaccines  Flu shot- Every fall.  Tetanus shot- Every 10 years.  Menactra- Single dose; prevents meningitis.  Take these steps  Don't smoke- If you do smoke, ask your healthcare provider about quitting. For tips on how to quit, go to www.smokefree.gov or call 1-800-QUIT-NOW.  Be physically active- Exercise 5 days a week for at least 30 minutes.  If you are not already physically active start slow and gradually work up to 30 minutes of moderate physical activity.  Examples of moderate activity include walking briskly, mowing the yard, dancing, swimming bicycling, etc.  Eat a healthy diet- Eat a variety of healthy foods such as fruits, vegetables, low fat milk, low fat cheese, yogurt, lean meats, poultry, fish, beans, tofu, etc.  For more information on healthy eating, go to www.thenutritionsource.org  Drink alcohol in moderation- Limit alcohol intake two drinks or less a day.  Never drink and drive.  Dentist- Brush and floss teeth twice daily; visit your dentis  twice a year.  Depression-Your emotional health is as important as your physical health.  If you're feeling down, losing interest in things you normally enjoy please talk with your healthcare provider.  Gun Safety- If you keep a gun in your home, keep it unloaded and with the safety lock on.  Bullets should be stored separately.  Helmet use- Always wear a helmet when riding a motorcycle, bicycle, rollerblading or skateboarding.  Safe sex- If you may be exposed to a sexually transmitted infection, use a condom  Seat belts- Seat bels can save your life; always wear one.  Smoke/Carbon Monoxide detectors- These detectors need to be installed on the appropriate level of your home.  Replace batteries at least once a year.  Skin Cancer- When out in the sun, cover up and use sunscreen SPF 15 or higher.  Violence- If anyone is threatening or hurting you, please tell your healthcare provider.   IF you received an x-ray today, you will receive an invoice from [facility]. Please contact [facility] at [phone] with questions or concerns regarding your invoice.   IF you received labwork today, you will receive an invoice from [facility]. Please contact [facility] at [phone] with questions or concerns regarding your invoice.   Our billing staff will not be able to assist you with questions regarding bills from these companies.  You will be contacted with the lab results as soon as they are available. The fastest way to get your results is to activate your My Chart account. Instructions are located on the last page of this paperwork. If you have not heard from us regarding the results in 2 weeks, please contact this office.        I personally performed the services described in this documentation, which was scribed in my presence. The recorded information has been reviewed and considered, and addended by me as needed.   Signed,   Drew StaggersJeffrey  Jo Cerone, MD [facility].  05/14/16 8:23 AM

## 2016-11-27 ENCOUNTER — Encounter: Payer: Self-pay | Admitting: Family Medicine

## 2016-11-27 ENCOUNTER — Ambulatory Visit (INDEPENDENT_AMBULATORY_CARE_PROVIDER_SITE_OTHER): Payer: 59 | Admitting: Family Medicine

## 2016-11-27 VITALS — BP 156/88 | HR 89 | Temp 98.2°F | Resp 17 | Ht 76.0 in | Wt 243.0 lb

## 2016-11-27 DIAGNOSIS — J301 Allergic rhinitis due to pollen: Secondary | ICD-10-CM | POA: Diagnosis not present

## 2016-11-27 DIAGNOSIS — J0100 Acute maxillary sinusitis, unspecified: Secondary | ICD-10-CM | POA: Diagnosis not present

## 2016-11-27 MED ORDER — PSEUDOEPHEDRINE HCL ER 120 MG PO TB12: 120.0000 mg | ORAL_TABLET | Freq: Two times a day (BID) | ORAL | 0 refills | Status: DC

## 2016-11-27 MED ORDER — CETIRIZINE HCL 10 MG PO TABS
10.0000 mg | ORAL_TABLET | Freq: Every day | ORAL | 11 refills | Status: DC
Start: 2016-11-27 — End: 2020-07-15

## 2016-11-27 MED ORDER — AMOXICILLIN 500 MG PO CAPS: 1000.0000 mg | ORAL_CAPSULE | Freq: Two times a day (BID) | ORAL | 0 refills | Status: DC

## 2016-11-27 NOTE — Progress Notes (Signed)
By signing my name below, I, [name], attest that this documentation has been prepared under the direction and in the presence of Norberto Sorenson, MD.  Electronically Signed: Arvilla Market, Medical Scribe. 11/27/16. 5:29 PM.  Subjective:    Patient ID: Drew Wolf, male    DOB: [birth year], 36 y.o.   MRN: [id number]  HPI Chief Complaint  Patient presents with  . Allergic Rhinitis     HPI Comments: Tyshawn Ciullo is a 36 y.o. male who presents to [facility] Care at [facility] complaining of seasonal allergies onset 1.5 weeks ago. Reports associated sxs of congestion, rhinorrhea, wet cough with chunky sputum, "scratchy" throat, sleep disturbance, chills a couple of days ago but has resolved, sinus pressure, and sinus pain. He took muscinex, advil, and tylenol for little relief of his sxs. Denies itchy eyes, watery eyes.   There are no active problems to display for this patient.  Past Medical History:  Diagnosis Date  . Depression    Past Surgical History:  Procedure Laterality Date  . ANTERIOR CRUCIATE LIGAMENT REPAIR    . KNEE ARTHROSCOPY WITH POSTERIOR CRUCIATE LIGAMENT (PCL) RECONSTRUCTION    . MEDIAL COLLATERAL LIGAMENT AND LATERAL COLLATERAL LIGAMENT REPAIR, KNEE    . WISDOM TOOTH EXTRACTION     No Known Allergies Prior to Admission medications   Medication Sig Start Date End Date Taking? Authorizing Provider  guaiFENesin (MUCINEX) 600 MG 12 hr tablet Take by mouth 2 (two) times daily.   Yes [provider]  ibuprofen (ADVIL,MOTRIN) 200 MG tablet Take 200 mg by mouth every 6 (six) hours as needed.   Yes [provider]  pseudoephedrine-acetaminophen (TYLENOL SINUS) 30-500 MG TABS tablet Take 1 tablet by mouth every 4 (four) hours as needed.   Yes [provider]   Social History   Social History  . Marital status: Single    Spouse name: N/A  . Number of children: N/A  . Years of education: N/A   Occupational History  . Not on file.    Social History Main Topics  . Smoking status: Current Some Day Smoker    Packs/day: 0.50    Types: Cigarettes  . Smokeless tobacco: Never Used  . Alcohol use Yes     Comment: occ.  . Drug use: No  . Sexual activity: No   Other Topics Concern  . Not on file   Social History Narrative  . No narrative on file   Review of Systems  Constitutional: Positive for chills.  HENT: Positive for congestion, rhinorrhea, sinus pain, sinus pressure and sore throat.   Eyes: Negative for discharge and itching.  Respiratory: Positive for cough.   Allergic/Immunologic: Positive for environmental allergies.  Psychiatric/Behavioral: Positive for sleep disturbance.   Objective:  Physical Exam  Constitutional: He appears well-developed and well-nourished. No distress.  HENT:  Head: Normocephalic and atraumatic.  Right Ear: External ear and ear canal normal.  Left Ear: Tympanic membrane is injected. A middle ear effusion is present.  Nose: Mucosal edema and rhinorrhea (purulent, R>L) present.  Mouth/Throat: Posterior oropharyngeal erythema present.  Eyes: Conjunctivae are normal.  Mild erythema and puffiness beneath each eye bilterally  Neck: Neck supple.  Cardiovascular: Normal rate, regular rhythm, S1 normal, S2 normal and normal heart sounds.  Exam reveals no gallop and no friction rub.   No murmur heard. Pulmonary/Chest: Effort normal and breath sounds normal. No respiratory distress. He has no wheezes. He has no rales.  Lymphadenopathy:       Head (right  side): Submandibular adenopathy present. No occipital adenopathy present.       Head (left side): Submandibular adenopathy present. No occipital adenopathy present.    He has no cervical adenopathy.  Neurological: He is alert.  Skin: Skin is warm and dry.  Psychiatric: He has a normal mood and affect. His behavior is normal.  Nursing note and vitals reviewed.   Vitals:   11/27/16 1655  BP: (!) 156/88  Pulse: 89  Resp: 17  Temp:  98.2 F (36.8 C)  TempSrc: Oral  SpO2: 97%  Weight: 243 lb (110.2 kg)  Height: 6\' 4"  (1.93 m)  Body mass index is 29.58 kg/m. Assessment & Plan:   1. Seasonal allergic rhinitis due to pollen   2. Acute non-recurrent maxillary sinusitis      Meds ordered this encounter  Medications  . guaiFENesin (MUCINEX) 600 MG 12 hr tablet    Sig: Take by mouth 2 (two) times daily.  Marland Kitchen. ibuprofen (ADVIL,MOTRIN) 200 MG tablet    Sig: Take 200 mg by mouth every 6 (six) hours as needed.  . pseudoephedrine-acetaminophen (TYLENOL SINUS) 30-500 MG TABS tablet    Sig: Take 1 tablet by mouth every 4 (four) hours as needed.  Marland Kitchen. amoxicillin (AMOXIL) 500 MG capsule    Sig: Take 2 capsules (1,000 mg total) by mouth 2 (two) times daily.    Dispense:  40 capsule    Refill:  0  . pseudoephedrine (SUDAFED 12 HOUR) 120 MG 12 hr tablet    Sig: Take 1 tablet (120 mg total) by mouth 2 (two) times daily.    Dispense:  30 tablet    Refill:  0  . cetirizine (ZYRTEC) 10 MG tablet    Sig: Take 1 tablet (10 mg total) by mouth at bedtime.    Dispense:  30 tablet    Refill:  11    I personally performed the services described in this documentation, which was scribed in my presence. The recorded information has been reviewed and considered, and addended by me as needed.   Norberto SorensonEva Shaw, M.D.  Primary Care at [facility]   [address] [place], [place] [address] [phone] phone [phone] fax  11/27/16 10:19 PM

## 2016-11-27 NOTE — Patient Instructions (Addendum)
   IF you received an x-ray today, you will receive an invoice from [facility]. Please contact [facility] at [phone] with questions or concerns regarding your invoice.   IF you received labwork today, you will receive an invoice from [facility]. Please contact [facility] at [phone] with questions or concerns regarding your invoice.   Our billing staff will not be able to assist you with questions regarding bills from these companies.  You will be contacted with the lab results as soon as they are available. The fastest way to get your results is to activate your My Chart account. Instructions are located on the last page of this paperwork. If you have not heard from us regarding the results in 2 weeks, please contact this office.      Sinusitis, Adult Sinusitis is soreness and inflammation of your sinuses. Sinuses are hollow spaces in the bones around your face. Your sinuses are located:  Around your eyes.  In the middle of your forehead.  Behind your nose.  In your cheekbones. Your sinuses and nasal passages are lined with a stringy fluid (mucus). Mucus normally drains out of your sinuses. When your nasal tissues become inflamed or swollen, the mucus can become trapped or blocked so air cannot flow through your sinuses. This allows bacteria, viruses, and funguses to grow, which leads to infection. Sinusitis can develop quickly and last for 7?10 days (acute) or for more than 12 weeks (chronic). Sinusitis often develops after a cold. What are the causes? This condition is caused by anything that creates swelling in the sinuses or stops mucus from draining, including:  Allergies.  Asthma.  Bacterial or viral infection.  Abnormally shaped bones between the nasal passages.  Nasal growths that contain mucus (nasal polyps).  Narrow sinus openings.  Pollutants, such as chemicals or irritants in the air.  A foreign object stuck in the nose.  A fungal  infection. This is rare. What increases the risk? The following factors may make you more likely to develop this condition:  Having allergies or asthma.  Having had a recent cold or respiratory tract infection.  Having structural deformities or blockages in your nose or sinuses.  Having a weak immune system.  Doing a lot of swimming or diving.  Overusing nasal sprays.  Smoking. What are the signs or symptoms? The main symptoms of this condition are pain and a feeling of pressure around the affected sinuses. Other symptoms include:  Upper toothache.  Earache.  Headache.  Bad breath.  Decreased sense of smell and taste.  A cough that may get worse at night.  Fatigue.  Fever.  Thick drainage from your nose. The drainage is often green and it may contain pus (purulent).  Stuffy nose or congestion.  Postnasal drip. This is when extra mucus collects in the throat or back of the nose.  Swelling and warmth over the affected sinuses.  Sore throat.  Sensitivity to light. How is this diagnosed? This condition is diagnosed based on symptoms, a medical history, and a physical exam. To find out if your condition is acute or chronic, your health care provider may:  Look in your nose for signs of nasal polyps.  Tap over the affected sinus to check for signs of infection.  View the inside of your sinuses using an imaging device that has a light attached (endoscope). If your health care provider suspects that you have chronic sinusitis, you may also:  Be tested for allergies.  Have a sample of   mucus taken from your nose (nasal culture) and checked for bacteria.  Have a mucus sample examined to see if your sinusitis is related to an allergy. If your sinusitis does not respond to treatment and it lasts longer than 8 weeks, you may have an MRI or CT scan to check your sinuses. These scans also help to determine how severe your infection is. In rare cases, a bone biopsy may  be done to rule out more serious types of fungal sinus disease. How is this treated? Treatment for sinusitis depends on the cause and whether your condition is chronic or acute. If a virus is causing your sinusitis, your symptoms will go away on their own within 10 days. You may be given medicines to relieve your symptoms, including:  Topical nasal decongestants. They shrink swollen nasal passages and let mucus drain from your sinuses.  Antihistamines. These drugs block inflammation that is triggered by allergies. This can help to ease swelling in your nose and sinuses.  Topical nasal corticosteroids. These are nasal sprays that ease inflammation and swelling in your nose and sinuses.  Nasal saline washes. These rinses can help to get rid of thick mucus in your nose. If your condition is caused by bacteria, you will be given an antibiotic medicine. If your condition is caused by a fungus, you will be given an antifungal medicine. Surgery may be needed to correct underlying conditions, such as narrow nasal passages. Surgery may also be needed to remove polyps. Follow these instructions at home: Medicines   Take, use, or apply over-the-counter and prescription medicines only as told by your health care provider. These may include nasal sprays.  If you were prescribed an antibiotic medicine, take it as told by your health care provider. Do not stop taking the antibiotic even if you start to feel better. Hydrate and Humidify   Drink enough water to keep your urine clear or pale yellow. Staying hydrated will help to thin your mucus.  Use a cool mist humidifier to keep the humidity level in your home above 50%.  Inhale steam for 10-15 minutes, 3-4 times a day or as told by your health care provider. You can do this in the bathroom while a hot shower is running.  Limit your exposure to cool or dry air. Rest   Rest as much as possible.  Sleep with your head raised (elevated).  Make sure to  get enough sleep each night. General instructions   Apply a warm, moist washcloth to your face 3-4 times a day or as told by your health care provider. This will help with discomfort.  Wash your hands often with soap and water to reduce your exposure to viruses and other germs. If soap and water are not available, use hand sanitizer.  Do not smoke. Avoid being around people who are smoking (secondhand smoke).  Keep all follow-up visits as told by your health care provider. This is important. Contact a health care provider if:  You have a fever.  Your symptoms get worse.  Your symptoms do not improve within 10 days. Get help right away if:  You have a severe headache.  You have persistent vomiting.  You have pain or swelling around your face or eyes.  You have vision problems.  You develop confusion.  Your neck is stiff.  You have trouble breathing. This information is not intended to replace advice given to you by your health care provider. Make sure you discuss any questions you have with   your health care provider. Document Released: 07/06/2005 Document Revised: 03/01/2016 Document Reviewed: 05/01/2015 Elsevier Interactive Patient Education  2017 [employer]  

## 2018-03-01 DIAGNOSIS — M9903 Segmental and somatic dysfunction of lumbar region: Secondary | ICD-10-CM | POA: Diagnosis not present

## 2018-03-01 DIAGNOSIS — M9902 Segmental and somatic dysfunction of thoracic region: Secondary | ICD-10-CM | POA: Diagnosis not present

## 2018-03-01 DIAGNOSIS — M9905 Segmental and somatic dysfunction of pelvic region: Secondary | ICD-10-CM | POA: Diagnosis not present

## 2018-03-05 DIAGNOSIS — Z0000 Encounter for general adult medical examination without abnormal findings: Secondary | ICD-10-CM | POA: Diagnosis not present

## 2018-03-19 DIAGNOSIS — E559 Vitamin D deficiency, unspecified: Secondary | ICD-10-CM | POA: Diagnosis not present

## 2018-03-19 DIAGNOSIS — R945 Abnormal results of liver function studies: Secondary | ICD-10-CM | POA: Diagnosis not present

## 2018-03-19 DIAGNOSIS — E782 Mixed hyperlipidemia: Secondary | ICD-10-CM | POA: Diagnosis not present

## 2018-06-26 DIAGNOSIS — Z79899 Other long term (current) drug therapy: Secondary | ICD-10-CM | POA: Diagnosis not present

## 2018-06-26 DIAGNOSIS — E559 Vitamin D deficiency, unspecified: Secondary | ICD-10-CM | POA: Diagnosis not present

## 2018-06-26 DIAGNOSIS — R748 Abnormal levels of other serum enzymes: Secondary | ICD-10-CM | POA: Diagnosis not present

## 2018-06-26 DIAGNOSIS — M25561 Pain in right knee: Secondary | ICD-10-CM | POA: Diagnosis not present

## 2018-07-05 DIAGNOSIS — M25561 Pain in right knee: Secondary | ICD-10-CM | POA: Diagnosis not present

## 2018-07-06 DIAGNOSIS — M25561 Pain in right knee: Secondary | ICD-10-CM | POA: Diagnosis not present

## 2018-07-11 DIAGNOSIS — M2341 Loose body in knee, right knee: Secondary | ICD-10-CM | POA: Diagnosis not present

## 2018-07-11 DIAGNOSIS — M2410 Other articular cartilage disorders, unspecified site: Secondary | ICD-10-CM | POA: Diagnosis not present

## 2018-07-28 DIAGNOSIS — G8918 Other acute postprocedural pain: Secondary | ICD-10-CM | POA: Diagnosis not present

## 2018-07-28 DIAGNOSIS — M94261 Chondromalacia, right knee: Secondary | ICD-10-CM | POA: Diagnosis not present

## 2018-08-05 DIAGNOSIS — M25561 Pain in right knee: Secondary | ICD-10-CM | POA: Diagnosis not present

## 2018-08-12 DIAGNOSIS — M25561 Pain in right knee: Secondary | ICD-10-CM | POA: Diagnosis not present

## 2018-08-18 DIAGNOSIS — M25561 Pain in right knee: Secondary | ICD-10-CM | POA: Diagnosis not present

## 2018-08-25 DIAGNOSIS — M25561 Pain in right knee: Secondary | ICD-10-CM | POA: Diagnosis not present

## 2018-09-01 DIAGNOSIS — M25561 Pain in right knee: Secondary | ICD-10-CM | POA: Diagnosis not present

## 2020-07-14 DIAGNOSIS — M546 Pain in thoracic spine: Secondary | ICD-10-CM | POA: Insufficient documentation

## 2020-07-14 DIAGNOSIS — F17210 Nicotine dependence, cigarettes, uncomplicated: Secondary | ICD-10-CM | POA: Diagnosis not present

## 2020-07-14 DIAGNOSIS — R109 Unspecified abdominal pain: Secondary | ICD-10-CM | POA: Diagnosis not present

## 2020-07-14 NOTE — ED Triage Notes (Signed)
Pt arrives EMS after a fall around 1930. Pt c/o pain in the left flak area after the fall.

## 2020-07-15 ENCOUNTER — Encounter (HOSPITAL_COMMUNITY): Payer: Self-pay

## 2020-07-15 ENCOUNTER — Other Ambulatory Visit: Payer: Self-pay

## 2020-07-15 ENCOUNTER — Emergency Department (HOSPITAL_COMMUNITY)
Admission: EM | Admit: 2020-07-15 | Discharge: 2020-07-15 | Disposition: A | Discharge status: Home / Self Care | Payer: Commercial Managed Care - PPO | Attending: Emergency Medicine | Admitting: Emergency Medicine

## 2020-07-15 ENCOUNTER — Emergency Department (HOSPITAL_COMMUNITY): Payer: Commercial Managed Care - PPO

## 2020-07-15 DIAGNOSIS — R109 Unspecified abdominal pain: Secondary | ICD-10-CM

## 2020-07-15 LAB — URINALYSIS, ROUTINE W REFLEX MICROSCOPIC
Bilirubin Urine: NEGATIVE
Glucose, UA: NEGATIVE mg/dL
Hgb urine dipstick: NEGATIVE
Ketones, ur: NEGATIVE mg/dL
Leukocytes,Ua: NEGATIVE
Nitrite: NEGATIVE
Protein, ur: NEGATIVE mg/dL
Specific Gravity, Urine: 1.014 (ref 1.005–1.030)
pH: 5 (ref 5.0–8.0)

## 2020-07-15 LAB — CBC WITH DIFFERENTIAL/PLATELET
Abs Immature Granulocytes: 0.06 10*3/uL (ref 0.00–0.07)
Basophils Absolute: 0.1 10*3/uL (ref 0.0–0.1)
Basophils Relative: 0 %
Eosinophils Absolute: 0.1 10*3/uL (ref 0.0–0.5)
Eosinophils Relative: 1 %
HCT: 46.6 % (ref 39.0–52.0)
Hemoglobin: 15.3 g/dL (ref 13.0–17.0)
Immature Granulocytes: 1 %
Lymphocytes Relative: 17 %
Lymphs Abs: 2.2 10*3/uL (ref 0.7–4.0)
MCH: 31 pg (ref 26.0–34.0)
MCHC: 32.8 g/dL (ref 30.0–36.0)
MCV: 94.3 fL (ref 80.0–100.0)
Monocytes Absolute: 0.5 10*3/uL (ref 0.1–1.0)
Monocytes Relative: 4 %
Neutro Abs: 10.1 10*3/uL — ABNORMAL HIGH (ref 1.7–7.7)
Neutrophils Relative %: 77 %
Platelets: 286 10*3/uL (ref 150–400)
RBC: 4.94 MIL/uL (ref 4.22–5.81)
RDW: 12.4 % (ref 11.5–15.5)
WBC: 12.9 10*3/uL — ABNORMAL HIGH (ref 4.0–10.5)
nRBC: 0 % (ref 0.0–0.2)

## 2020-07-15 LAB — BASIC METABOLIC PANEL
Anion gap: 10 (ref 5–15)
BUN: 12 mg/dL (ref 6–20)
CO2: 25 mmol/L (ref 22–32)
Calcium: 9 mg/dL (ref 8.9–10.3)
Chloride: 106 mmol/L (ref 98–111)
Creatinine, Ser: 0.76 mg/dL (ref 0.61–1.24)
GFR, Estimated: >60 mL/min (ref >60)
Glucose, Bld: 112 mg/dL — ABNORMAL HIGH (ref 70–99)
Potassium: 4.1 mmol/L (ref 3.5–5.1)
Sodium: 141 mmol/L (ref 135–145)

## 2020-07-15 MED ORDER — OXYCODONE-ACETAMINOPHEN 5-325 MG PO TABS
2.0000 | ORAL_TABLET | Freq: Once | ORAL | Status: AC
  Administered 2020-07-15: 2 via ORAL
  Filled 2020-07-15: qty 2

## 2020-07-15 MED ORDER — DIAZEPAM 2 MG PO TABS
2.0000 mg | ORAL_TABLET | Freq: Once | ORAL | Status: AC
  Administered 2020-07-15: 2 mg via ORAL
  Filled 2020-07-15: qty 1

## 2020-07-15 NOTE — ED Provider Notes (Signed)
[facility]-EMERGENCY DEPT Provider Note   CSN: [id number] Arrival date & time: 07/14/20  2354     History Chief Complaint  Patient presents with  . Fall  . Flank Pain    Drew Wolf is a 39 y.o. male.  Patient did have a fall however the symptoms he is here describing are nothing new this fall.  Patient states that he was walking to the refrigerator reached out with his left hand to open refrigerator had acute onset of left-sided back pain that radiated to his left lower abdomen and groin area.  Patient states it brought him to his knees.  He continued with pain so presented here for further evaluation.  No urinary changes.  He states the reason the fall was brought up is that he did fall on the couch while messing with his nephew however it was not traumatic did not have any pain at that time.  The pain started hours later.  No fever cough.  Movement makes it worse.  Touching it makes it worse.  No rashes.  No recent illnesses or fevers.   Fall  Flank Pain       Past Medical History:  Diagnosis Date  . Depression     There are no problems to display for this patient.   Past Surgical History:  Procedure Laterality Date  . ANTERIOR CRUCIATE LIGAMENT REPAIR    . KNEE ARTHROSCOPY WITH POSTERIOR CRUCIATE LIGAMENT (PCL) RECONSTRUCTION    . MEDIAL COLLATERAL LIGAMENT AND LATERAL COLLATERAL LIGAMENT REPAIR, KNEE    . WISDOM TOOTH EXTRACTION         No family history on file.  Social History   Tobacco Use  . Smoking status: Current Some Day Smoker    Packs/day: 0.50    Types: Cigarettes  . Smokeless tobacco: Never Used  Substance Use Topics  . Alcohol use: Yes    Comment: occ.  . Drug use: No    Home Medications Prior to Admission medications   Medication Sig Start Date End Date Taking? Authorizing Provider  cetirizine (ZYRTEC) 10 MG tablet Take 1 tablet (10 mg total) by mouth at bedtime. Patient not taking: No sig reported 11/27/16  07/15/20  Sherren Mocha, MD    Allergies    Patient has no known allergies.  Review of Systems   Review of Systems  Genitourinary: Positive for flank pain.  All other systems reviewed and are negative.   Physical Exam Updated Vital Signs BP (!) 145/90   Pulse 90   Temp 98.4 F (36.9 C) (Oral)   Resp 18   Ht 6\' 4"  (1.93 m)   Wt 122.5 kg   SpO2 98%   BMI 32.87 kg/m   Physical Exam Vitals and nursing note reviewed.  Constitutional:      Appearance: He is well-developed and well-nourished.  HENT:     Head: Normocephalic and atraumatic.     Nose: No congestion or rhinorrhea.     Mouth/Throat:     Mouth: Mucous membranes are moist.     Pharynx: Oropharynx is clear.  Eyes:     Pupils: Pupils are equal, round, and reactive to light.  Cardiovascular:     Rate and Rhythm: Normal rate.  Pulmonary:     Effort: Pulmonary effort is normal. No respiratory distress.  Abdominal:     General: Abdomen is flat. There is no distension.  Musculoskeletal:        General: Tenderness (left lower thoracic paraspinal and less  so in left flank) present. Normal range of motion.     Cervical back: Normal range of motion.  Skin:    General: Skin is warm and dry.     Coloration: Skin is not jaundiced or pale.  Neurological:     General: No focal deficit present.     Mental Status: He is alert.     ED Results / Procedures / Treatments   Labs (all labs ordered are listed, but only abnormal results are displayed) Labs Reviewed  CBC WITH DIFFERENTIAL/PLATELET - Abnormal; Notable for the following components:      Result Value   WBC 12.9 (*)    Neutro Abs 10.1 (*)    All other components within normal limits  BASIC METABOLIC PANEL - Abnormal; Notable for the following components:   Glucose, Bld 112 (*)    All other components within normal limits  URINALYSIS, ROUTINE W REFLEX MICROSCOPIC    EKG None  Radiology CT Renal Stone Study  Result Date: 07/15/2020 CLINICAL DATA:  Left  flank pain after fall earlier today. EXAM: CT ABDOMEN AND PELVIS WITHOUT CONTRAST TECHNIQUE: Multidetector CT imaging of the abdomen and pelvis was performed following the standard protocol without IV contrast. COMPARISON:  None. FINDINGS: Lower chest: The lung bases are clear without focal nodule, mass, or airspace disease. Heart size is normal. No significant pleural or pericardial effusion is present. Hepatobiliary: No focal liver abnormality is seen. No gallstones, gallbladder wall thickening, or biliary dilatation. Pancreas: Unremarkable. No pancreatic ductal dilatation or surrounding inflammatory changes. Spleen: Normal in size without focal abnormality. Adrenals/Urinary Tract: Kidneys and ureters are within normal limits. No stone or mass lesion is present. No obstruction is present. The urinary bladder is within normal limits. Stomach/Bowel: The stomach and duodenum are within normal limits. Small bowel is unremarkable. Terminal ileum is within normal limits. The appendix is visualized and normal. The ascending and transverse colon are within normal limits. Descending and sigmoid colon are normal. Vascular/Lymphatic: No significant vascular findings are present. No enlarged abdominal or pelvic lymph nodes. Reproductive: Prostate is unremarkable. Other: No abdominal wall hernia or abnormality. No abdominopelvic ascites. Musculoskeletal: Vertebral body heights and alignment are normal. No focal lytic or blastic lesions are present. Bony pelvis is normal. Hips are located and within normal limits. IMPRESSION: Negative CT of the abdomen and pelvis. Electronically Signed   By: Marin Roberts M.D.   On: 07/15/2020 01:41    Procedures Procedures (including critical care time)  Medications Ordered in ED Medications  oxyCODONE-acetaminophen (PERCOCET/ROXICET) 5-325 MG per tablet 2 tablet (2 tablets Oral Given 07/15/20 0100)  diazepam (VALIUM) tablet 2 mg (2 mg Oral Given 07/15/20 0100)    ED Course   I have reviewed the triage vital signs and the nursing notes.  Pertinent labs & imaging results that were available during my care of the patient were reviewed by me and considered in my medical decision making (see chart for details).    MDM Rules/Calculators/A&P                          Patient without a kidney stone.  Pain improved here with muscle relaxer and with pain medication.  Unclear etiology this time.  Consider possible vascular issue with acute onset and severe pain however patient seems to be more comfortable now.  He had point tenderness on arrival and left flank area consider splenic infarct however did not see any splenomegaly or risk factors for the  same.  Shared decision making with the patient we will hold off on doing a contrasted CT at this time.  If his symptoms worsen, do not get better or has any new symptoms return here immediately for repeat evaluation. Final Clinical Impression(s) / ED Diagnoses Final diagnoses:  Flank pain    Rx / DC Orders ED Discharge Orders    None       Nicolus Ose, Barbara Cower, MD 07/15/20 [phone]

## 2022-05-20 ENCOUNTER — Emergency Department (HOSPITAL_BASED_OUTPATIENT_CLINIC_OR_DEPARTMENT_OTHER): Payer: Commercial Managed Care - PPO

## 2022-05-20 ENCOUNTER — Other Ambulatory Visit: Payer: Self-pay

## 2022-05-20 ENCOUNTER — Encounter (HOSPITAL_BASED_OUTPATIENT_CLINIC_OR_DEPARTMENT_OTHER): Payer: Self-pay

## 2022-05-20 ENCOUNTER — Emergency Department (HOSPITAL_BASED_OUTPATIENT_CLINIC_OR_DEPARTMENT_OTHER)
Admission: EM | Admit: 2022-05-20 | Discharge: 2022-05-20 | Disposition: A | Discharge status: Home / Self Care | Payer: No Typology Code available for payment source | Attending: Emergency Medicine | Admitting: Emergency Medicine

## 2022-05-20 DIAGNOSIS — S0993XA Unspecified injury of face, initial encounter: Secondary | ICD-10-CM | POA: Diagnosis present

## 2022-05-20 DIAGNOSIS — S0232XA Fracture of orbital floor, left side, initial encounter for closed fracture: Secondary | ICD-10-CM | POA: Insufficient documentation

## 2022-05-20 DIAGNOSIS — S00511A Abrasion of lip, initial encounter: Secondary | ICD-10-CM | POA: Insufficient documentation

## 2022-05-20 DIAGNOSIS — S022XXA Fracture of nasal bones, initial encounter for closed fracture: Secondary | ICD-10-CM | POA: Diagnosis not present

## 2022-05-20 DIAGNOSIS — W208XXA Other cause of strike by thrown, projected or falling object, initial encounter: Secondary | ICD-10-CM | POA: Diagnosis not present

## 2022-05-20 DIAGNOSIS — Y9301 Activity, walking, marching and hiking: Secondary | ICD-10-CM | POA: Diagnosis not present

## 2022-05-20 MED ORDER — OXYCODONE-ACETAMINOPHEN 5-325 MG PO TABS: 1.0000 | ORAL_TABLET | Freq: Four times a day (QID) | ORAL | 0 refills | Status: AC | PRN

## 2022-05-20 NOTE — ED Notes (Signed)
Patient transported to CT 

## 2022-05-20 NOTE — Discharge Instructions (Signed)
Your CAT scan showed orbital floor and maxillary sinus fracture as well as a nasal bone fracture.  I have spoken with the on-call ENT he would like to see you in the office on Monday morning.  They will reach out in regards to an appointment.  He would also like you to follow-up with ophthalmology in the next 1 week for an exam.  In the meantime, please keep your head elevated, use ice to the area on for 20 minutes, off for 20 minutes, and do not blow your nose.  You may take ibuprofen or Tylenol for pain, and use the prescribed medication if you have more severe pain.  As we discussed, you should really not work until cleared by your doctors regarding your injury.

## 2022-05-20 NOTE — Progress Notes (Signed)
ENT Brief consult note   Ed called for facial fractures.  AP: left orbital blowout fracture without clinical or radiographic evidence of entrapment (based off emergency physician examination) and nasal bone fracture. Patient currently in community ED setting not at [facility] or [facility]. I've recommended outpatient follow up with myself in 5 to 7 days. Will request clinic follow up Monday, November 6.  Please ensure opht halmology referral is placed to have preop evaluation in case orbital surgery is needed in the next 2 to 3 weeks.

## 2022-05-20 NOTE — ED Triage Notes (Signed)
Patient here POV from   Endorses being struck in the Face by a 900 lb Drum. Occurred at 0900.  No LOC. No Anticoagulants. Hard Hat and Safety Glasses in place.   NAD Noted during Triage. A&Ox4. GCS 15. Ambulatory.

## 2022-05-20 NOTE — ED Provider Notes (Signed)
[facility] EMERGENCY DEPT Provider Note   CSN: [id number] Arrival date & time: 05/20/22  1401     History  Chief Complaint  Patient presents with   Facial Injury    Drew Wolf is a 41 y.o. male.  Patient presents to the emergency department today for evaluation of left-sided facial injury.  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  Patient states that the drum fell and struck the left side of his face.  He denies other injury.  He reports being dazed but no loss of consciousness.  He has had some swelling and bruising of the left face.  No vision changes.  No vomiting or confusion.  No neck pain.  No significant dental injury but he did report some bleeding from the gums.  He has had ibuprofen prior to arrival.  He was wearing a hard hat and safety glasses when the incident occurred.        Home Medications Prior to Admission medications   Medication Sig Start Date End Date Taking? Authorizing Provider  cetirizine (ZYRTEC) 10 MG tablet Take 1 tablet (10 mg total) by mouth at bedtime. Patient not taking: No sig reported 11/27/16 07/15/20  Sherren Mocha, MD      Allergies    Patient has no known allergies.    Review of Systems   Review of Systems  Physical Exam Updated Vital Signs BP (!) 186/88 (BP Location: Right Arm)   Pulse 77   Temp 98 F (36.7 C) (Temporal)   Resp 18   Ht 6\' 4"  (1.93 m)   Wt 122.5 kg   SpO2 99%   BMI 32.87 kg/m   Physical Exam Vitals and nursing note reviewed.  Constitutional:      Appearance: He is well-developed.  HENT:     Head: Normocephalic. No raccoon eyes or Battle's sign.     Comments: Ecchymosis inferior to the left orbit and some abrasions noted on the left upper lip.    Right Ear: Tympanic membrane, ear canal and external ear normal. No hemotympanum.     Left Ear: Tympanic membrane, ear canal and  external ear normal. No hemotympanum.     Nose: Nose normal.     Mouth/Throat:     Mouth: Mucous membranes are moist.     Comments: No obvious dental injury.  No active bleeding.  No tenderness over the mandible.  He does have mild tenderness over the left maxillary bones.  No palpable deformity. Eyes:     General: Lids are normal.     Conjunctiva/sclera: Conjunctivae normal.     Pupils: Pupils are equal, round, and reactive to light.     Comments: No visible hyphema.  Globe appears normal.  Patient reports pain with upward gaze of the left eye.  Cardiovascular:     Rate and Rhythm: Normal rate and regular rhythm.  Pulmonary:     Effort: Pulmonary effort is normal.     Breath sounds: Normal breath sounds.  Abdominal:     Palpations: Abdomen is soft.     Tenderness: There is no abdominal tenderness.  Musculoskeletal:        General: Normal range of motion.     Cervical back: Normal range of motion and neck supple. No tenderness or bony tenderness.     Thoracic back: No tenderness or bony tenderness.  Lumbar back: No tenderness or bony tenderness.  Skin:    General: Skin is warm and dry.  Neurological:     Mental Status: He is alert and oriented to person, place, and time.     GCS: GCS eye subscore is 4. GCS verbal subscore is 5. GCS motor subscore is 6.     Cranial Nerves: No cranial nerve deficit.     Sensory: No sensory deficit.     Coordination: Coordination normal.    ED Results / Procedures / Treatments   Labs (all labs ordered are listed, but only abnormal results are displayed) Labs Reviewed - No data to display  EKG None  Radiology CT Maxillofacial WO CM  Result Date: 05/20/2022 CLINICAL DATA:  Facial trauma. EXAM: CT HEAD WITHOUT CONTRAST CT MAXILLOFACIAL WITHOUT CONTRAST TECHNIQUE: Multidetector CT imaging of the head and maxillofacial structures were performed using the standard protocol without intravenous contrast. Multiplanar CT image reconstructions of the  maxillofacial structures were also generated. RADIATION DOSE REDUCTION: This exam was performed according to the departmental dose-optimization program which includes automated exposure control, adjustment of the mA and/or kV according to patient size and/or use of iterative reconstruction technique. COMPARISON:  None Available. FINDINGS: CT HEAD FINDINGS Brain: No evidence of acute infarction, hemorrhage, hydrocephalus, extra-axial collection or mass lesion/mass effect. Vascular: No hyperdense vessel or unexpected calcification. Skull: Normal. Negative for fracture or focal lesion. Other: None. CT MAXILLOFACIAL FINDINGS Osseous: There appears to be a moderately displaced left orbital floor fracture. Minimally displaced left lateral orbital wall fracture is noted. Moderately depressed left nasal bone fracture is noted. Orbits: Large amount of gas is seen in the left orbit consistent with left orbital floor fracture. Sinuses: Fluid level is noted in left maxillary sinus most consistent with hemorrhage. Soft tissues: Contusion of the subcutaneous tissues is noted overlying the left maxillary region. IMPRESSION: No acute intracranial abnormality seen. Moderately displaced left orbital floor fracture is noted with probable hemorrhage seen in the left maxillary sinus as well as a large amount of gas is seen in the left orbit. Moderately depressed left nasal bone fracture is noted as well. Subcutaneous contusion is seen overlying the left maxillary region. Electronically Signed   By: Lupita Raider M.D.   On: 05/20/2022 15:02   CT Head Wo Contrast  Result Date: 05/20/2022 CLINICAL DATA:  Facial trauma. EXAM: CT HEAD WITHOUT CONTRAST CT MAXILLOFACIAL WITHOUT CONTRAST TECHNIQUE: Multidetector CT imaging of the head and maxillofacial structures were performed using the standard protocol without intravenous contrast. Multiplanar CT image reconstructions of the maxillofacial structures were also generated. RADIATION DOSE  REDUCTION: This exam was performed according to the departmental dose-optimization program which includes automated exposure control, adjustment of the mA and/or kV according to patient size and/or use of iterative reconstruction technique. COMPARISON:  None Available. FINDINGS: CT HEAD FINDINGS Brain: No evidence of acute infarction, hemorrhage, hydrocephalus, extra-axial collection or mass lesion/mass effect. Vascular: No hyperdense vessel or unexpected calcification. Skull: Normal. Negative for fracture or focal lesion. Other: None. CT MAXILLOFACIAL FINDINGS Osseous: There appears to be a moderately displaced left orbital floor fracture. Minimally displaced left lateral orbital wall fracture is noted. Moderately depressed left nasal bone fracture is noted. Orbits: Large amount of gas is seen in the left orbit consistent with left orbital floor fracture. Sinuses: Fluid level is noted in left maxillary sinus most consistent with hemorrhage. Soft tissues: Contusion of the subcutaneous tissues is noted overlying the left maxillary region. IMPRESSION: No acute intracranial abnormality seen. Moderately  displaced left orbital floor fracture is noted with probable hemorrhage seen in the left maxillary sinus as well as a large amount of gas is seen in the left orbit. Moderately depressed left nasal bone fracture is noted as well. Subcutaneous contusion is seen overlying the left maxillary region. Electronically Signed   By: Marijo Conception M.D.   On: 05/20/2022 15:02    Procedures Procedures    Medications Ordered in ED Medications - No data to display  ED Course/ Medical Decision Making/ A&P    Patient seen and examined. History obtained directly from patient.   Labs/EKG: None ordered  Imaging: CT maxillofacial and CT head  Medications/Fluids: Offered pain medication, patient declines.  Most recent vital signs reviewed and are as follows: BP (!) 186/88 (BP Location: Right Arm)   Pulse 77   Temp 98  F (36.7 C) (Temporal)   Resp 18   Ht 6\' 4"  (1.93 m)   Wt 122.5 kg   SpO2 99%   BMI 32.87 kg/m   Initial impression: Facial injury, abrasion, contusion.  Will need to rule out orbital fracture.  3:17 PM Reassessment performed. Patient appears stable.  Imaging personally visualized and interpreted including: CT head and maxillofacial, agree orbital floor fracture, nasal bone fracture, normal head.  Reviewed pertinent lab work and imaging with patient at bedside. Questions answered.   Most current vital signs reviewed and are as follows: BP (!) 186/88 (BP Location: Right Arm)   Pulse 77   Temp 98 F (36.7 C) (Temporal)   Resp 18   Ht 6\' 4"  (1.93 m)   Wt 122.5 kg   SpO2 99%   BMI 32.87 kg/m   Plan: Will need to discuss with on-call ENT for recommendations.    Visual Acuity  Right Eye Distance: 20/15 Left Eye Distance: 20/15 Bilateral Distance: 20/15  Right Eye Near:   Left Eye Near:    Bilateral Near:     3:51 PM Reassessment performed. Patient appears stable.  I consulted with Dr. Sabino Gasser of ENT.  Has reviewed imaging.  Recommends follow-up in the office on Monday, please review ENT will reach out regarding an appointment.  Agrees with no nose blowing, pain control, elevation, ice.  Also would like patient to follow-up with ophthalmology in the next 1 week for an evaluation.  I will provide ENT referral.  Patient and wife updated on plan.  I encouraged patient not to work until cleared by ENT.  Most current vital signs reviewed and are as follows: BP (!) 186/88 (BP Location: Right Arm)   Pulse 77   Temp 98 F (36.7 C) (Temporal)   Resp 18   Ht 6\' 4"  (1.93 m)   Wt 122.5 kg   SpO2 99%   BMI 32.87 kg/m   Plan: Discharge to home.   Prescriptions written for: Percocet  Patient counseled on use of narcotic pain medications. Counseled not to combine these medications with others containing tylenol. Urged not to drink alcohol, drive, or perform any other activities  that requires focus while taking these medications. The patient verbalizes understanding and agrees with the plan.  Other home care instructions discussed: Other than recommendations as above, OTC Tylenol and ibuprofen if pain is more minor.  ED return instructions discussed: Return with confusion, vomiting, severe uncontrolled pain  Follow-up instructions discussed: Specialty follow-up with ENT and ophthalmology as above.  Medical Decision Making Amount and/or Complexity of Data Reviewed Radiology: ordered.   Patient with facial trauma and orbital floor, maxillary, nasal bone fractures.  He has slight restriction with upward gaze but no significant entrapment on exam.  Discussed with on-call ENT with recommendations and follow-up as above.  Patient has normal vision and globe appears intact without significant and degree.  Head CT is negative.  No neck pain.  The patient's vital signs, pertinent lab work and imaging were reviewed and interpreted as discussed in the ED course. Hospitalization was considered for further testing, treatments, or serial exams/observation. However as patient is well-appearing, has a stable exam, and reassuring studies today, I do not feel that they warrant admission at this time. This plan was discussed with the patient who verbalizes agreement and comfort with this plan and seems reliable and able to return to the Emergency Department with worsening or changing symptoms.          Final Clinical Impression(s) / ED Diagnoses Final diagnoses:  Closed fracture of left orbital floor, initial encounter [facility])  Closed fracture of nasal bone, initial encounter    Rx / DC Orders ED Discharge Orders          Ordered    oxyCODONE-acetaminophen (PERCOCET/ROXICET) 5-325 MG tablet  Every 6 hours PRN        05/20/22 1547              Renne Crigler, PA-C 05/20/22 1554    Gwyneth Sprout, MD 05/23/22 1146

## 2022-05-20 NOTE — ED Notes (Signed)
RN provided AVS using Teachback Method. Patient verbalizes understanding of Discharge Instructions. Opportunity for Questioning and Answers were provided by RN. Patient Discharged from ED ambulatory to Home with Family. ? ?

## 2022-08-13 IMAGING — CT CT RENAL STONE PROTOCOL
2 of 4 series · 16 of 46 positions shown, 18 images · non-contrast
Comparison: None.

CLINICAL DATA: Left flank pain after fall earlier today.

EXAM:
CT ABDOMEN AND PELVIS WITHOUT CONTRAST
TECHNIQUE: Multidetector CT imaging of the abdomen and pelvis was performed
following the standard protocol without IV contrast.

[Series 2: axial st · axial · 0.92mm/px · z∈[-614,-99]mm · 13 of 115 slices shown, 15 images]
[im 6/115  soft-tissue]
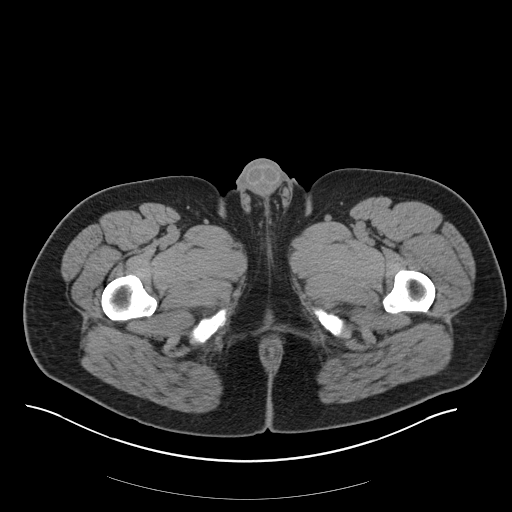
[im 6/115  bone]
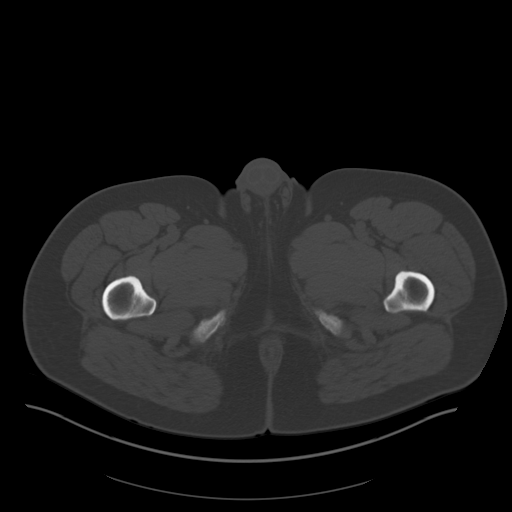
[im 18/115  soft-tissue]
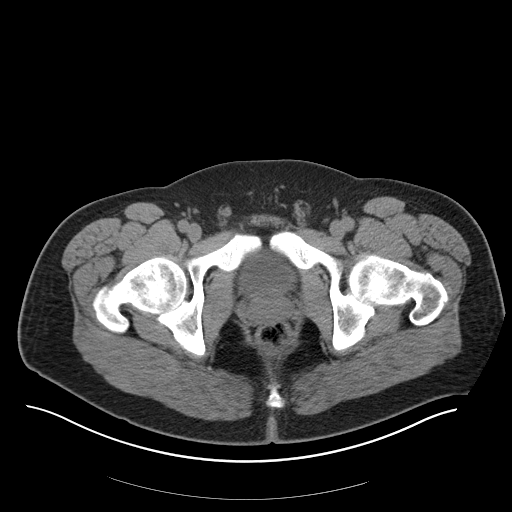
[im 23/115  soft-tissue]
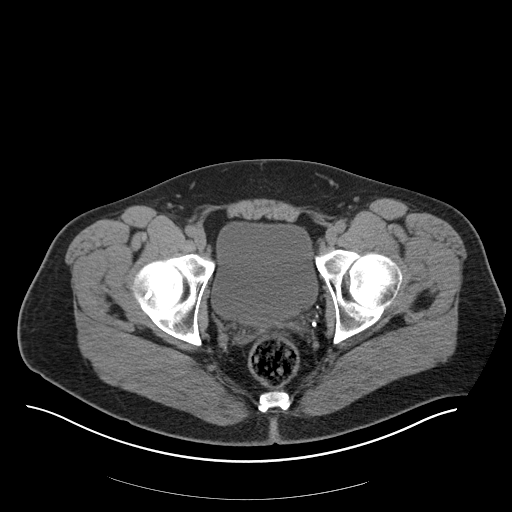
[im 35/115  soft-tissue]
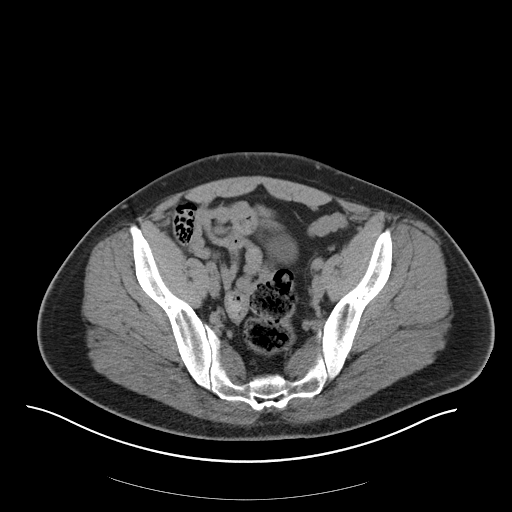
[im 40/115  soft-tissue]
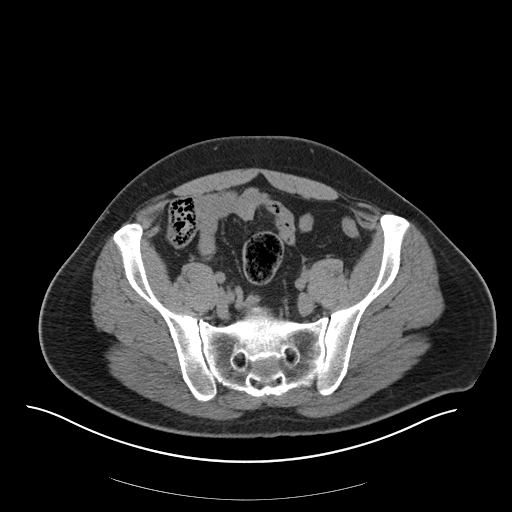
[im 52/115  soft-tissue]
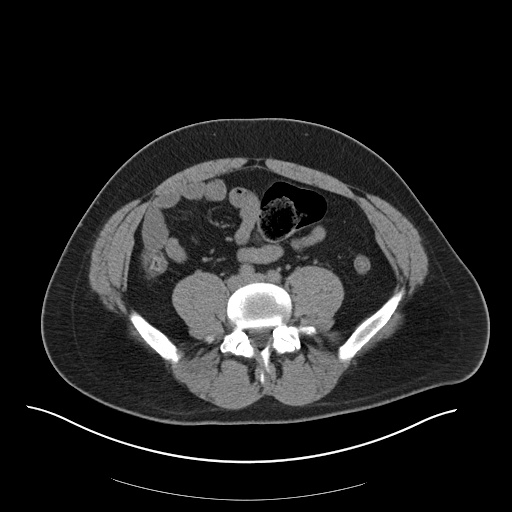
[im 58/115  soft-tissue]
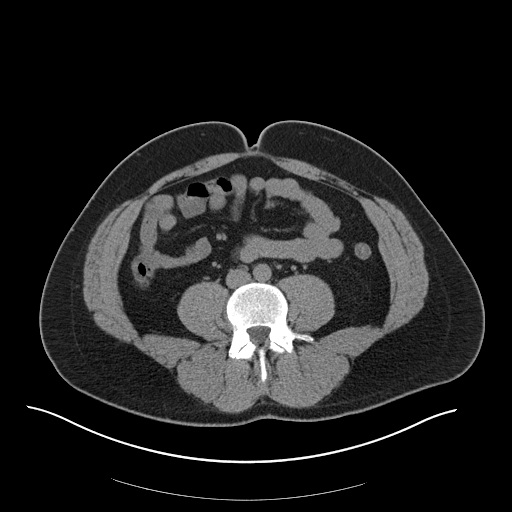
[im 63/115  soft-tissue]
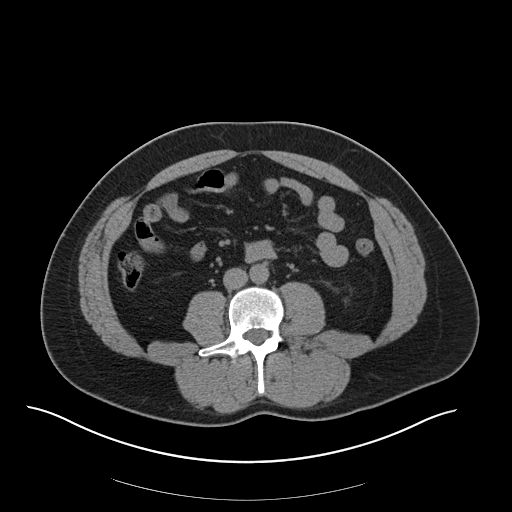
[im 75/115  soft-tissue]
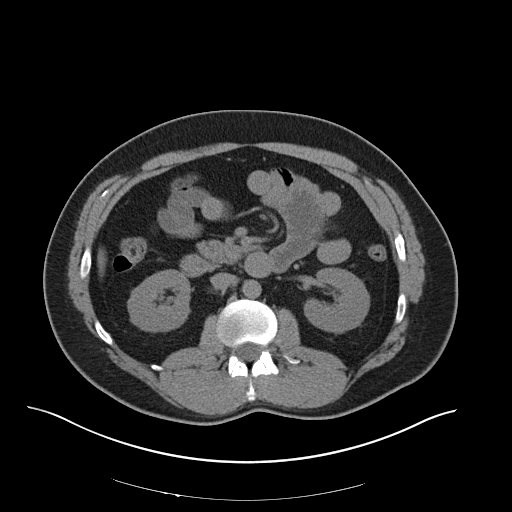
[im 75/115  bone]
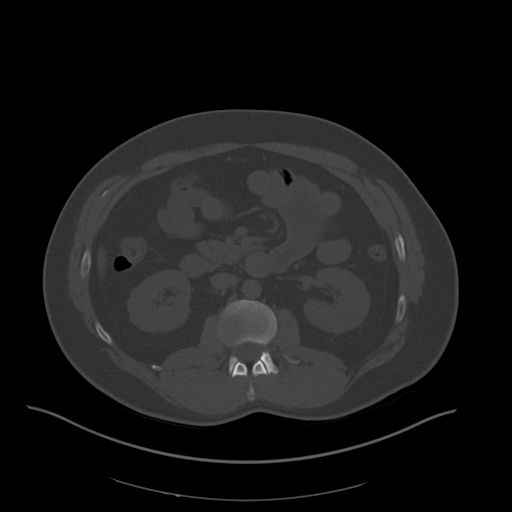
[im 80/115  soft-tissue]
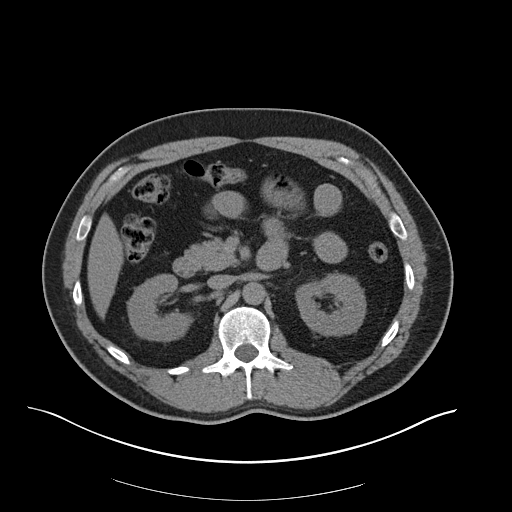
[im 92/115  soft-tissue]
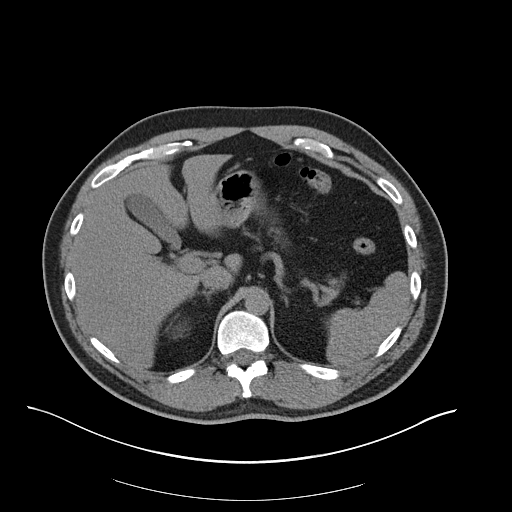
[im 97/115  soft-tissue]
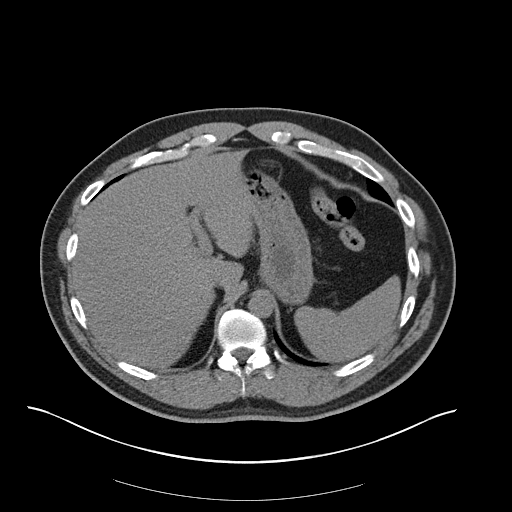
[im 109/115  soft-tissue]
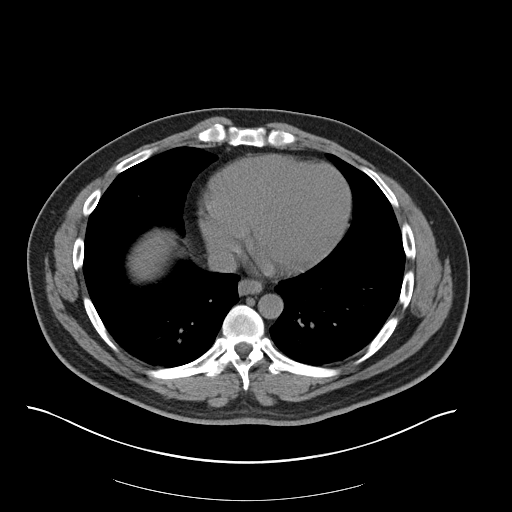

[Series 5: coronal · coronal · 0.89mm/px · 3 of 157 slices shown]
[im 53/157  soft-tissue]
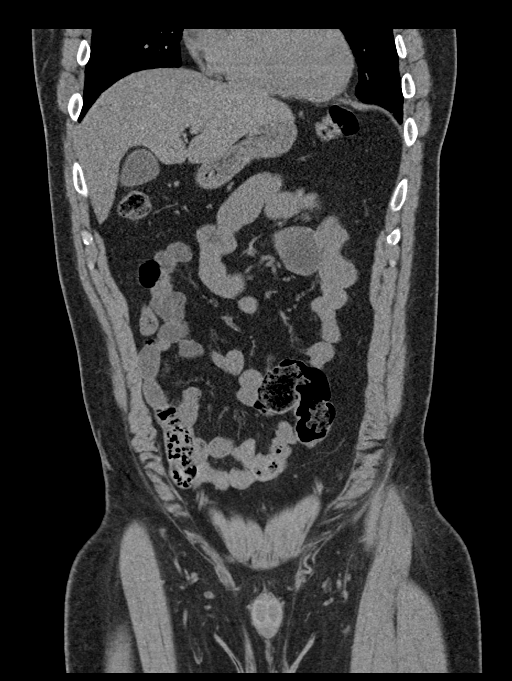
[im 70/157  soft-tissue]
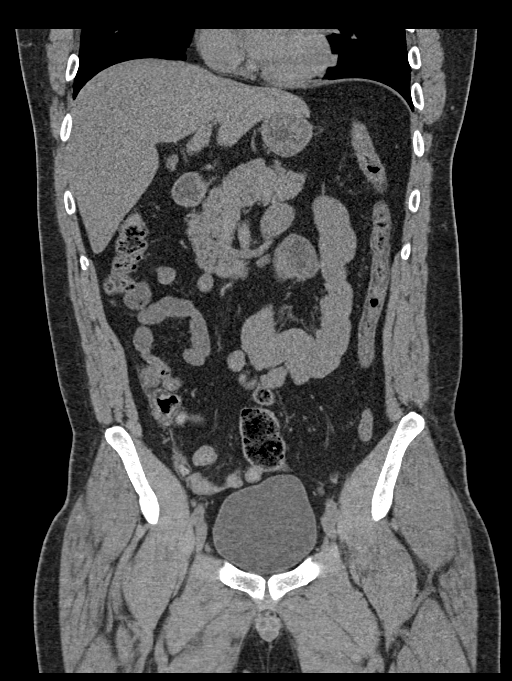
[im 87/157  soft-tissue]
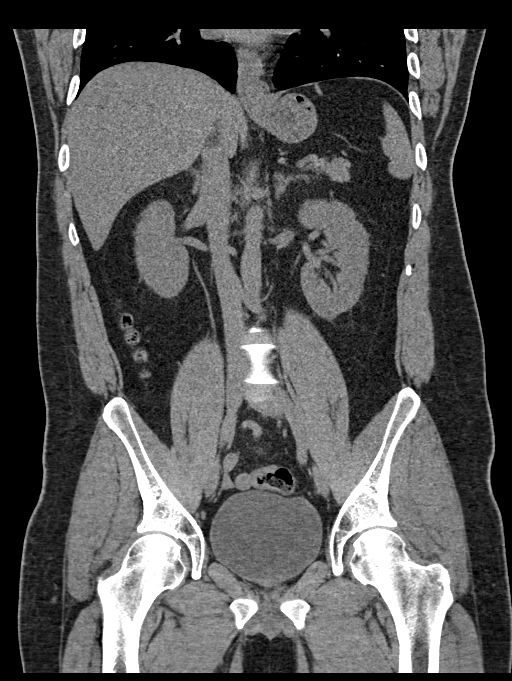

[16 of 46 positions shown; findings below may reference images not displayed]

FINDINGS: Lower chest: The lung bases are clear without focal nodule, mass, or
airspace disease. Heart size is normal. No significant pleural or
pericardial effusion is present.

Hepatobiliary: No focal liver abnormality is seen. No gallstones,
gallbladder wall thickening, or biliary dilatation.

Pancreas: Unremarkable. No pancreatic ductal dilatation or
surrounding inflammatory changes.

Spleen: Normal in size without focal abnormality.

Adrenals/Urinary Tract: Kidneys and ureters are within normal
limits. No stone or mass lesion is present. No obstruction is
present. The urinary bladder is within normal limits.

Stomach/Bowel: The stomach and duodenum are within normal limits.
Small bowel is unremarkable. Terminal ileum is within normal limits.
The appendix is visualized and normal. The ascending and transverse
colon are within normal limits. Descending and sigmoid colon are
normal.

Vascular/Lymphatic: No significant vascular findings are present. No
enlarged abdominal or pelvic lymph nodes.

Reproductive: Prostate is unremarkable.

Other: No abdominal wall hernia or abnormality. No abdominopelvic
ascites.

Musculoskeletal: Vertebral body heights and alignment are normal. No
focal lytic or blastic lesions are present. Bony pelvis is normal.
Hips are located and within normal limits.
IMPRESSION: Negative CT of the abdomen and pelvis.
